# Patient Record
Sex: Male | Born: 1980 | Race: White | Hispanic: No | Marital: Single | Smoking: Current some day smoker
Health system: Southern US, Community
[De-identification: ages and names within clinical notes are randomized; demographics above are authoritative.]

## PROBLEM LIST (undated history)

## (undated) DIAGNOSIS — F32A Depression, unspecified: Secondary | ICD-10-CM

## (undated) DIAGNOSIS — F329 Major depressive disorder, single episode, unspecified: Secondary | ICD-10-CM

## (undated) DIAGNOSIS — J45909 Unspecified asthma, uncomplicated: Secondary | ICD-10-CM

---
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
## 2012-07-04 ENCOUNTER — Encounter (HOSPITAL_BASED_OUTPATIENT_CLINIC_OR_DEPARTMENT_OTHER): Payer: Self-pay | Admitting: Emergency Medicine

## 2012-07-04 ENCOUNTER — Emergency Department (HOSPITAL_BASED_OUTPATIENT_CLINIC_OR_DEPARTMENT_OTHER): Payer: 59

## 2012-07-04 ENCOUNTER — Emergency Department (HOSPITAL_BASED_OUTPATIENT_CLINIC_OR_DEPARTMENT_OTHER)
Admission: EM | Admit: 2012-07-04 | Discharge: 2012-07-04 | Disposition: A | Discharge status: Home / Self Care | Payer: 59 | Attending: Emergency Medicine | Admitting: Emergency Medicine

## 2012-07-04 DIAGNOSIS — J45909 Unspecified asthma, uncomplicated: Secondary | ICD-10-CM | POA: Insufficient documentation

## 2012-07-04 DIAGNOSIS — F17200 Nicotine dependence, unspecified, uncomplicated: Secondary | ICD-10-CM | POA: Insufficient documentation

## 2012-07-04 DIAGNOSIS — S93402A Sprain of unspecified ligament of left ankle, initial encounter: Secondary | ICD-10-CM

## 2012-07-04 DIAGNOSIS — S93409A Sprain of unspecified ligament of unspecified ankle, initial encounter: Secondary | ICD-10-CM | POA: Insufficient documentation

## 2012-07-04 DIAGNOSIS — Y929 Unspecified place or not applicable: Secondary | ICD-10-CM | POA: Insufficient documentation

## 2012-07-04 DIAGNOSIS — Y9389 Activity, other specified: Secondary | ICD-10-CM | POA: Insufficient documentation

## 2012-07-04 DIAGNOSIS — W108XXA Fall (on) (from) other stairs and steps, initial encounter: Secondary | ICD-10-CM | POA: Insufficient documentation

## 2012-07-04 HISTORY — DX: Unspecified asthma, uncomplicated: J45.909

## 2012-07-04 MED ORDER — TRAMADOL HCL 50 MG PO TABS: 50.0000 mg | ORAL_TABLET | Freq: Four times a day (QID) | ORAL | Status: DC | PRN

## 2012-07-04 NOTE — ED Provider Notes (Signed)
History     CSN: [id number]  Arrival date & time 07/04/12  4098   None     Chief Complaint  Patient presents with  . Ankle Pain    (Consider location/radiation/quality/duration/timing/severity/associated sxs/prior treatment) Patient is a 32 y.o. male presenting with ankle pain. The history is provided by the patient.  Ankle Pain Location:  Ankle Time since incident:  1 day Injury: yes   Mechanism of injury: fall   Fall:    Fall occurred: crawling into a window and landed on left lateral ankle.   Height of fall:  < 2 feet   Impact surface:  [employer] of impact: left ankle.   Entrapped after fall: no   Ankle location:  L ankle Pain details:    Quality:  Aching   Radiates to:  Does not radiate   Severity:  Severe   Duration:  1 day   Timing:  Constant   Progression:  Unchanged Chronicity:  New Dislocation: no   Foreign body present:  No foreign bodies Prior injury to area:  No Relieved by:  Nothing Worsened by:  Nothing tried Ineffective treatments:  None tried Associated symptoms: no muscle weakness   Risk factors: no concern for non-accidental trauma   has had previous injury  No past medical history on file.  No past surgical history on file.  No family history on file.  History  Substance Use Topics  . Smoking status: Not on file  . Smokeless tobacco: Not on file  . Alcohol Use: Not on file      Review of Systems  Skin: Negative for wound.  All other systems reviewed and are negative.    Allergies  Review of patient's allergies indicates not on file.  Home Medications  No current outpatient prescriptions on file.  There were no vitals taken for this visit.  Physical Exam  Constitutional: He is oriented to person, place, and time. He appears well-developed and well-nourished. No distress.  HENT:  Head: Normocephalic and atraumatic.  Eyes: Conjunctivae are normal. Pupils are equal, round, and reactive to light.  Neck: Normal range of  motion. Neck supple.  Cardiovascular: Normal rate, regular rhythm and intact distal pulses.   Pulmonary/Chest: Effort normal and breath sounds normal. He has no wheezes. He has no rales.  Abdominal: Soft. Bowel sounds are normal. There is no tenderness. There is no rebound and no guarding.  Musculoskeletal: Normal range of motion.  Left foot neurovascularly intact FROM of the left ankle, pain over the anterior talar ligament.  2+ dorsalis pedis cap refill to toes < 2 sec  Neurological: He is alert and oriented to person, place, and time.  Skin: Skin is warm and dry.    ED Course  Procedures (including critical care time)  Labs Reviewed - No data to display No results found.   No diagnosis found.    MDM  Suspect tiny bone fragment is from prior injury as there is not ecchymosis or swelling.  Have informed the patient of bony fragment and will treat as sprain with ASO and pain medications       Eryka Dolinger K Argil Mahl-Rasch, MD 07/04/12 [phone]

## 2012-07-04 NOTE — ED Notes (Signed)
Pt c/o left ankle pain after falling yesterday morning.

## 2014-08-19 ENCOUNTER — Emergency Department (HOSPITAL_BASED_OUTPATIENT_CLINIC_OR_DEPARTMENT_OTHER): Payer: Managed Care, Other (non HMO)

## 2014-08-19 ENCOUNTER — Emergency Department (HOSPITAL_BASED_OUTPATIENT_CLINIC_OR_DEPARTMENT_OTHER)
Admission: EM | Admit: 2014-08-19 | Discharge: 2014-08-19 | Disposition: A | Discharge status: Home / Self Care | Payer: Managed Care, Other (non HMO) | Attending: Emergency Medicine | Admitting: Emergency Medicine

## 2014-08-19 ENCOUNTER — Encounter (HOSPITAL_BASED_OUTPATIENT_CLINIC_OR_DEPARTMENT_OTHER): Payer: Self-pay

## 2014-08-19 DIAGNOSIS — Z880 Allergy status to penicillin: Secondary | ICD-10-CM | POA: Insufficient documentation

## 2014-08-19 DIAGNOSIS — Z720 Tobacco use: Secondary | ICD-10-CM | POA: Diagnosis not present

## 2014-08-19 DIAGNOSIS — J40 Bronchitis, not specified as acute or chronic: Secondary | ICD-10-CM | POA: Diagnosis not present

## 2014-08-19 DIAGNOSIS — R05 Cough: Secondary | ICD-10-CM | POA: Diagnosis present

## 2014-08-19 MED ORDER — ACETAMINOPHEN 500 MG PO TABS
1000.0000 mg | ORAL_TABLET | Freq: Once | ORAL | Status: AC
  Administered 2014-08-19: 1000 mg via ORAL
  Filled 2014-08-19: qty 2

## 2014-08-19 MED ORDER — PREDNISONE 20 MG PO TABS: ORAL_TABLET | ORAL | Status: DC

## 2014-08-19 MED ORDER — ALBUTEROL SULFATE HFA 108 (90 BASE) MCG/ACT IN AERS
2.0000 | INHALATION_SPRAY | RESPIRATORY_TRACT | Status: DC | PRN
  Administered 2014-08-19: 2 via RESPIRATORY_TRACT
  Filled 2014-08-19: qty 6.7

## 2014-08-19 MED ORDER — HYDROCODONE-HOMATROPINE 5-1.5 MG/5ML PO SYRP: 5.0000 mL | ORAL_SOLUTION | Freq: Four times a day (QID) | ORAL | Status: DC | PRN

## 2014-08-19 NOTE — Discharge Instructions (Signed)
Upper Respiratory Infection, Adult An upper respiratory infection (URI) is also sometimes known as the common cold. The upper respiratory tract includes the nose, sinuses, throat, trachea, and bronchi. Bronchi are the airways leading to the lungs. Most people improve within 1 week, but symptoms can last up to 2 weeks. A residual cough may last even longer.  CAUSES Many different viruses can infect the tissues lining the upper respiratory tract. The tissues become irritated and inflamed and often become very moist. Mucus production is also common. A cold is contagious. You can easily spread the virus to others by oral contact. This includes kissing, sharing a glass, coughing, or sneezing. Touching your mouth or nose and then touching a surface, which is then touched by another person, can also spread the virus. SYMPTOMS  Symptoms typically develop 1 to 3 days after you come in contact with a cold virus. Symptoms vary from person to person. They may include:  Runny nose.  Sneezing.  Nasal congestion.  Sinus irritation.  Sore throat.  Loss of voice (laryngitis).  Cough.  Fatigue.  Muscle aches.  Loss of appetite.  Headache.  Low-grade fever. DIAGNOSIS  You might diagnose your own cold based on familiar symptoms, since most people get a cold 2 to 3 times a year. Your caregiver can confirm this based on your exam. Most importantly, your caregiver can check that your symptoms are not due to another disease such as strep throat, sinusitis, pneumonia, asthma, or epiglottitis. Blood tests, throat tests, and X-rays are not necessary to diagnose a common cold, but they may sometimes be helpful in excluding other more serious diseases. Your caregiver will decide if any further tests are required. RISKS AND COMPLICATIONS  You may be at risk for a more severe case of the common cold if you smoke cigarettes, have chronic heart disease (such as heart failure) or lung disease (such as asthma), or if  you have a weakened immune system. The very young and very old are also at risk for more serious infections. Bacterial sinusitis, middle ear infections, and bacterial pneumonia can complicate the common cold. The common cold can worsen asthma and chronic obstructive pulmonary disease (COPD). Sometimes, these complications can require emergency medical care and may be life-threatening. PREVENTION  The best way to protect against getting a cold is to practice good hygiene. Avoid oral or hand contact with people with cold symptoms. Wash your hands often if contact occurs. There is no clear evidence that vitamin C, vitamin E, echinacea, or exercise reduces the chance of developing a cold. However, it is always recommended to get plenty of rest and practice good nutrition. TREATMENT  Treatment is directed at relieving symptoms. There is no cure. Antibiotics are not effective, because the infection is caused by a virus, not by bacteria. Treatment may include:  Increased fluid intake. Sports drinks offer valuable electrolytes, sugars, and fluids.  Breathing heated mist or steam (vaporizer or shower).  Eating chicken soup or other clear broths, and maintaining good nutrition.  Getting plenty of rest.  Using gargles or lozenges for comfort.  Controlling fevers with ibuprofen or acetaminophen as directed by your caregiver.  Increasing usage of your inhaler if you have asthma. Zinc gel and zinc lozenges, taken in the first 24 hours of the common cold, can shorten the duration and lessen the severity of symptoms. Pain medicines may help with fever, muscle aches, and throat pain. A variety of non-prescription medicines are available to treat congestion and runny nose. Your caregiver   can make recommendations and may suggest nasal or lung inhalers for other symptoms.  HOME CARE INSTRUCTIONS   Only take over-the-counter or prescription medicines for pain, discomfort, or fever as directed by your  caregiver.  Use a warm mist humidifier or inhale steam from a shower to increase air moisture. This may keep secretions moist and make it easier to breathe.  Drink enough water and fluids to keep your urine clear or pale yellow.  Rest as needed.  Return to work when your temperature has returned to normal or as your caregiver advises. You may need to stay home longer to avoid infecting others. You can also use a face mask and careful hand washing to prevent spread of the virus. SEEK MEDICAL CARE IF:   After the first few days, you feel you are getting worse rather than better.  You need your caregiver's advice about medicines to control symptoms.  You develop chills, worsening shortness of breath, or brown or red sputum. These may be signs of pneumonia.  You develop yellow or brown nasal discharge or pain in the face, especially when you bend forward. These may be signs of sinusitis.  You develop a fever, swollen neck glands, pain with swallowing, or white areas in the back of your throat. These may be signs of strep throat. SEEK IMMEDIATE MEDICAL CARE IF:   You have a fever.  You develop severe or persistent headache, ear pain, sinus pain, or chest pain.  You develop wheezing, a prolonged cough, cough up blood, or have a change in your usual mucus (if you have chronic lung disease).  You develop sore muscles or a stiff neck. Document Released: 09/25/2000 Document Revised: 06/24/2011 Document Reviewed: 07/07/2013 Exit[employer] Patient Information 2015 [employer]. This information is not intended to replace advice given to you by your health care provider. Make sure you discuss any questions you have with your health care provider.  

## 2014-08-19 NOTE — ED Notes (Addendum)
Pt with productive cough - white sputum since Wednesday. OTC meds have not been effective. PT also has runny nose, nasal congestion, headaches. Lungs clear in all fields bilaterally at this time. PT with recent sick contact with [facility].

## 2014-08-19 NOTE — ED Provider Notes (Signed)
CSN: [id number]     Arrival date & time 08/19/14  1350 History   First MD Initiated Contact with Patient 08/19/14 1512     Chief Complaint  Patient presents with  . Cough     (Consider location/radiation/quality/duration/timing/severity/associated sxs/prior Treatment) HPI Comments: Patient presents with cough. He states the last 2 days he's had a cough which has been productive of white and clear sputum. He has some runny nose and nasal congestion. He also has a sore throat. He has some mild shortness of breath which she feels is related to his asthma and he is currently out of his inhaler. He denies any leg pain or swelling. He denies any chest pain. He does note that today started having a fever. He is also having myalgias. He's been using over-the-counter medicines without relief in his cough.  Patient is a 34 y.o. male presenting with cough.  Cough Associated symptoms: fever, rhinorrhea, shortness of breath, sore throat and wheezing   Associated symptoms: no chest pain, no chills, no diaphoresis, no headaches and no rash     Past Medical History  Diagnosis Date  . Asthma    History reviewed. No pertinent past surgical history. History reviewed. No pertinent family history. History  Substance Use Topics  . Smoking status: Current Every Day Smoker  . Smokeless tobacco: Not on file  . Alcohol Use: No    Review of Systems  Constitutional: Positive for fever and fatigue. Negative for chills and diaphoresis.  HENT: Positive for congestion, postnasal drip, rhinorrhea and sore throat. Negative for sneezing, trouble swallowing and voice change.   Eyes: Negative.   Respiratory: Positive for cough, shortness of breath and wheezing. Negative for chest tightness.   Cardiovascular: Negative for chest pain and leg swelling.  Gastrointestinal: Negative for nausea, vomiting, abdominal pain, diarrhea and blood in stool.  Genitourinary: Negative for frequency, hematuria, flank pain and  difficulty urinating.  Musculoskeletal: Negative for back pain and arthralgias.  Skin: Negative for rash.  Neurological: Negative for dizziness, speech difficulty, weakness, numbness and headaches.      Allergies  Penicillins  Home Medications   Prior to Admission medications   Medication Sig Start Date End Date Taking? Authorizing Provider  HYDROcodone-homatropine (HYCODAN) 5-1.5 MG/5ML syrup Take 5 mLs by mouth every 6 (six) hours as needed for cough. 08/19/14   Rolan BuccoMelanie Camri Molloy, MD  predniSONE (DELTASONE) 20 MG tablet 3 tabs po day one, then 2 po daily x 4 days 08/19/14   Rolan BuccoMelanie Tami Blass, MD  traMADol (ULTRAM) 50 MG tablet Take 1 tablet (50 mg total) by mouth every 6 (six) hours as needed for pain. 07/04/12   April Palumbo, MD   BP 110/82 mmHg  Pulse 105  Temp(Src) 101.5 F (38.6 C) (Oral)  Resp 16  Ht 5\' 5"  (1.651 m)  Wt 190 lb (86.183 kg)  BMI 31.62 kg/m2  SpO2 98% Physical Exam  Constitutional: He is oriented to person, place, and time. He appears well-developed and well-nourished.  HENT:  Head: Normocephalic and atraumatic.  Right Ear: External ear normal.  Left Ear: External ear normal.  Mouth/Throat: Oropharynx is clear and moist. No oropharyngeal exudate.  Eyes: Pupils are equal, round, and reactive to light.  Neck: Normal range of motion. Neck supple.  Cardiovascular: Normal rate, regular rhythm and normal heart sounds.   Pulmonary/Chest: Effort normal. No respiratory distress. He has wheezes. He has no rales. He exhibits no tenderness.  Scarce expiratory wheezing bilaterally. No increased work of breathing  Abdominal: Soft. Bowel  sounds are normal. There is no tenderness. There is no rebound and no guarding.  Musculoskeletal: Normal range of motion. He exhibits no edema.  No edema or calf tenderness  Lymphadenopathy:    He has no cervical adenopathy.  Neurological: He is alert and oriented to person, place, and time.  Skin: Skin is warm and dry. No rash noted.   Psychiatric: He has a normal mood and affect.    ED Course  Procedures (including critical care time) Labs Review Labs Reviewed - No data to display  Imaging Review Dg Chest 2 View  08/19/2014   CLINICAL DATA:  Cough and fever for 2 days, smoker  EXAM: CHEST  2 VIEW  COMPARISON:  None.  FINDINGS: Cardiomediastinal silhouette is unremarkable. No acute infiltrate or pleural effusion. No pulmonary edema. Mild perihilar increased bronchial markings without focal consolidation. Bony thorax is unremarkable.  IMPRESSION: No acute infiltrate or pulmonary edema. Mild perihilar increased bronchial markings without focal consolidation.   Electronically Signed   By: Natasha MeadLiviu  Pop M.D.   On: 08/19/2014 14:51     EKG Interpretation None      MDM   Final diagnoses:  Bronchitis    Patient presents with cough and URI symptoms. He likely has a bronchitis. There is no evidence of pneumonia. His lungs have very scarce wheezing place talking in full senses and has no increased work of breathing. His oxygen saturations are normal. He has no suggestions of a pulmonary embolus. He was discharged home in good condition. He was given a prescription for prednisone, Hycodan cough syrup and dispensed an albuterol inhaler. He was encouraged to follow-up with a primary care physician if his symptoms are not improving or return here as needed for any worsening symptoms.    Rolan BuccoMelanie Nasser Ku, MD 08/19/14 [phone]

## 2014-08-21 ENCOUNTER — Encounter (HOSPITAL_COMMUNITY): Payer: Self-pay | Admitting: *Deleted

## 2014-08-21 ENCOUNTER — Emergency Department (HOSPITAL_COMMUNITY)
Admission: EM | Admit: 2014-08-21 | Discharge: 2014-08-21 | Disposition: A | Discharge status: Home / Self Care | Payer: Managed Care, Other (non HMO) | Attending: Emergency Medicine | Admitting: Emergency Medicine

## 2014-08-21 DIAGNOSIS — J159 Unspecified bacterial pneumonia: Secondary | ICD-10-CM | POA: Diagnosis not present

## 2014-08-21 DIAGNOSIS — J45909 Unspecified asthma, uncomplicated: Secondary | ICD-10-CM | POA: Insufficient documentation

## 2014-08-21 DIAGNOSIS — R630 Anorexia: Secondary | ICD-10-CM | POA: Diagnosis not present

## 2014-08-21 DIAGNOSIS — R000 Tachycardia, unspecified: Secondary | ICD-10-CM | POA: Insufficient documentation

## 2014-08-21 DIAGNOSIS — R05 Cough: Secondary | ICD-10-CM | POA: Diagnosis present

## 2014-08-21 DIAGNOSIS — Z720 Tobacco use: Secondary | ICD-10-CM | POA: Diagnosis not present

## 2014-08-21 DIAGNOSIS — Z880 Allergy status to penicillin: Secondary | ICD-10-CM | POA: Insufficient documentation

## 2014-08-21 DIAGNOSIS — M791 Myalgia: Secondary | ICD-10-CM | POA: Diagnosis not present

## 2014-08-21 DIAGNOSIS — J189 Pneumonia, unspecified organism: Secondary | ICD-10-CM

## 2014-08-21 MED ORDER — ACETAMINOPHEN 325 MG PO TABS
650.0000 mg | ORAL_TABLET | Freq: Once | ORAL | Status: AC
  Administered 2014-08-21: 650 mg via ORAL
  Filled 2014-08-21: qty 2

## 2014-08-21 MED ORDER — BENZONATATE 100 MG PO CAPS: 100.0000 mg | ORAL_CAPSULE | Freq: Three times a day (TID) | ORAL | Status: DC

## 2014-08-21 MED ORDER — HYDROCODONE-HOMATROPINE 5-1.5 MG/5ML PO SYRP: 5.0000 mL | ORAL_SOLUTION | Freq: Four times a day (QID) | ORAL | Status: DC | PRN

## 2014-08-21 MED ORDER — AZITHROMYCIN 250 MG PO TABS: 250.0000 mg | ORAL_TABLET | Freq: Every day | ORAL | Status: DC

## 2014-08-21 MED ORDER — AZITHROMYCIN 250 MG PO TABS
500.0000 mg | ORAL_TABLET | Freq: Once | ORAL | Status: AC
  Administered 2014-08-21: 500 mg via ORAL
  Filled 2014-08-21: qty 2

## 2014-08-21 MED ORDER — BENZONATATE 100 MG PO CAPS
100.0000 mg | ORAL_CAPSULE | Freq: Once | ORAL | Status: AC
Start: 2014-08-21 — End: 2014-08-21
  Administered 2014-08-21: 100 mg via ORAL
  Filled 2014-08-21: qty 1

## 2014-08-21 MED ORDER — SODIUM CHLORIDE 0.9 % IV BOLUS (SEPSIS)
1000.0000 mL | Freq: Once | INTRAVENOUS | Status: AC
  Administered 2014-08-21: 1000 mL via INTRAVENOUS

## 2014-08-21 NOTE — Discharge Instructions (Signed)
Please read and follow all provided instructions.  Your diagnoses today include:  1. Community acquired pneumonia    Tests performed today include:  Vital signs. See below for your results today.   Medications prescribed:   Azithromycin - antibiotic for respiratory infection  You have been prescribed an antibiotic medicine: take the entire course of medicine even if you are feeling better. Stopping early can cause the antibiotic not to work.   Tessalon Perles - cough suppressant medication   Hycodan - narcotic cough suppressant syrup  You have been prescribed narcotic cough suppressant such as Tussinex: DO NOT drive or perform any activities that require you to be awake and alert because this medicine can make you drowsy.   Take any prescribed medications only as directed.  Home care instructions:  Follow any educational materials contained in this packet.  Take the complete course of antibiotics that you were prescribed.    Follow-up instructions: Please follow-up with your primary care provider in the next 3 days for further evaluation of your symptoms and to ensure resolution of your infection.   Return instructions:   Please return to the Emergency Department if you experience worsening symptoms.   Return immediately with worsening breathing, worsening shortness of breath, or if you feel it is taking you more effort to breathe.   Please return if you have any other emergent concerns.  Additional Information:  Your vital signs today were: BP 118/74 mmHg   Pulse 116   Temp(Src) 101.5 F (38.6 C) (Oral)   Resp 20   SpO2 95% If your blood pressure (BP) was elevated above 135/85 this visit, please have this repeated by your doctor within one month. --------------

## 2014-08-21 NOTE — ED Provider Notes (Signed)
CSN: [id number]     Arrival date & time 08/21/14  1151 History   First MD Initiated Contact with Patient 08/21/14 1218     Chief Complaint  Patient presents with  . URI     (Consider location/radiation/quality/duration/timing/severity/associated sxs/prior Treatment) HPI Comments: Patient presents with continued fever, productive cough, fatigue which has been ongoing for 4 days. Also complains of decreased appetite and fluid intake. Patient was seen at [facility] 2 days ago was diagnosed with bronchitis. He had a negative x-ray. Patient was prescribed an albuterol inhaler which he has been using. He was also prescribed prednisone and cough syrup which he was unable to pick up. Patient denies shortness of breath or chest pain. He has been using ibuprofen for fever and body aches. No hemoptysis. No skin rashes. No urinary symptoms. No nausea, vomiting, or diarrhea. The onset of this condition was acute. The course is constant. Aggravating factors: none. Alleviating factors: none.    The history is provided by the patient and a parent.    Past Medical History  Diagnosis Date  . Asthma    History reviewed. No pertinent past surgical history. History reviewed. No pertinent family history. History  Substance Use Topics  . Smoking status: Current Every Day Smoker  . Smokeless tobacco: Not on file  . Alcohol Use: No    Review of Systems  Constitutional: Positive for fever, chills, appetite change and fatigue.  HENT: Positive for sore throat (worse with coughing). Negative for congestion and rhinorrhea.   Eyes: Negative for redness.  Respiratory: Positive for cough. Negative for shortness of breath.   Cardiovascular: Negative for chest pain.  Gastrointestinal: Negative for nausea, vomiting, abdominal pain and diarrhea.  Genitourinary: Negative for dysuria.  Musculoskeletal: Positive for myalgias.  Skin: Negative for rash.  Neurological: Negative for headaches.       Allergies  Penicillins  Home Medications   Prior to Admission medications   Medication Sig Start Date End Date Taking? Authorizing Provider  guaiFENesin (ROBITUSSIN) 100 MG/5ML SOLN Take 5 mLs by mouth every 4 (four) hours as needed for cough or to loosen phlegm.   Yes Historical Provider, MD  ibuprofen (ADVIL,MOTRIN) 200 MG tablet Take 400 mg by mouth every 4 (four) hours as needed (for pain).   Yes Historical Provider, MD  HYDROcodone-homatropine (HYCODAN) 5-1.5 MG/5ML syrup Take 5 mLs by mouth every 6 (six) hours as needed for cough. Patient not taking: Reported on 08/21/2014 08/19/14   Rolan BuccoMelanie Belfi, MD  predniSONE (DELTASONE) 20 MG tablet 3 tabs po day one, then 2 po daily x 4 days Patient not taking: Reported on 08/21/2014 08/19/14   Rolan BuccoMelanie Belfi, MD  traMADol (ULTRAM) 50 MG tablet Take 1 tablet (50 mg total) by mouth every 6 (six) hours as needed for pain. Patient not taking: Reported on 08/21/2014 07/04/12   April Palumbo, MD   BP 118/74 mmHg  Pulse 116  Temp(Src) 101.5 F (38.6 C) (Oral)  Resp 20  SpO2 95%   Physical Exam  Constitutional: He appears well-developed and well-nourished.  HENT:  Head: Normocephalic and atraumatic.  Right Ear: Tympanic membrane, external ear and ear canal normal.  Left Ear: Tympanic membrane, external ear and ear canal normal.  Nose: Nose normal. No mucosal edema or rhinorrhea.  Mouth/Throat: Uvula is midline and mucous membranes are normal. Mucous membranes are not dry. No trismus in the jaw. No uvula swelling. No oropharyngeal exudate, posterior oropharyngeal edema, posterior oropharyngeal erythema or tonsillar abscesses.  Eyes: Conjunctivae are  normal. Pupils are equal, round, and reactive to light. Right eye exhibits no discharge. Left eye exhibits no discharge.  Neck: Normal range of motion. Neck supple.  Cardiovascular: Regular rhythm and normal heart sounds.  Tachycardia present.   No murmur heard. Pulmonary/Chest: Effort normal. No  respiratory distress. He has no wheezes. He has rales (Patient with crackles left middle and left base).  Abdominal: Soft. There is no tenderness.  Musculoskeletal: He exhibits no edema or tenderness.  Neurological: He is alert.  Skin: Skin is warm and dry.  Psychiatric: He has a normal mood and affect.  Nursing note and vitals reviewed.   ED Course  Procedures (including critical care time) Labs Review Labs Reviewed - No data to display  Imaging Review Dg Chest 2 View  08/19/2014   CLINICAL DATA:  Cough and fever for 2 days, smoker  EXAM: CHEST  2 VIEW  COMPARISON:  None.  FINDINGS: Cardiomediastinal silhouette is unremarkable. No acute infiltrate or pleural effusion. No pulmonary edema. Mild perihilar increased bronchial markings without focal consolidation. Bony thorax is unremarkable.  IMPRESSION: No acute infiltrate or pulmonary edema. Mild perihilar increased bronchial markings without focal consolidation.   Electronically Signed   By: Natasha MeadLiviu  Pop M.D.   On: 08/19/2014 14:51     EKG Interpretation None       12:53 PM Patient seen and examined. Work-up initiated. Medications ordered.   Vital signs reviewed and are as follows: BP 118/74 mmHg  Pulse 116  Temp(Src) 101.5 F (38.6 C) (Oral)  Resp 20  SpO2 95%  2:38 PM patient feels better with symptomatic treatment. Patient has exam consistent with community-acquired pneumonia. Suspect left lower lobe pneumonia given pulmonary findings.  Antibiotic started in emergency department.  Patient urged to return with worsening symptoms or other concerns. Patient verbalized understanding and agrees with plan.     MDM   Final diagnoses:  Community acquired pneumonia   Patients with exam and history consistent with community-acquired pneumonia. Patient has definite crackles on exam and I suspect left lower lobe pneumonia. Do not feel that repeat imaging is indicated at this time as patient clinically has pneumonia. He has normal  pulse ox. He does not appear septic. Do not feel that extensive lab testing is to be performed at this time. Patient started on antibiotics. Symptoms controlled in emergency department with supportive measures. Patient counseled on return instructions.    Renne CriglerJoshua Genna Casimir, PA-C 08/21/14 1440  Gerhard Munchobert Lockwood, MD 08/21/14 [phone]

## 2014-08-21 NOTE — ED Notes (Addendum)
Pt c/o URI, sts seen at [facility] recently and was diagnosed with bronchitis but hasn't been able to fill his prescription. He c/o productive cough and pain in his ribcage. Lung sounds are clear,diminsished, no fever.

## 2015-06-13 ENCOUNTER — Emergency Department (HOSPITAL_COMMUNITY)
Admission: EM | Admit: 2015-06-13 | Discharge: 2015-06-13 | Disposition: A | Discharge status: Other Acute Inpatient Hospital | Payer: Federal, State, Local not specified - Other | Attending: Emergency Medicine | Admitting: Emergency Medicine

## 2015-06-13 ENCOUNTER — Encounter (HOSPITAL_COMMUNITY): Payer: Self-pay

## 2015-06-13 ENCOUNTER — Inpatient Hospital Stay (HOSPITAL_COMMUNITY)
Admission: AD | Admit: 2015-06-13 | Discharge: 2015-06-20 | DRG: 885 | Disposition: A | Discharge status: Home / Self Care | Payer: Medicaid Other | Source: Intra-hospital | Attending: Psychiatry | Admitting: Psychiatry

## 2015-06-13 ENCOUNTER — Encounter (HOSPITAL_COMMUNITY): Payer: Self-pay | Admitting: Emergency Medicine

## 2015-06-13 DIAGNOSIS — F250 Schizoaffective disorder, bipolar type: Principal | ICD-10-CM | POA: Diagnosis present

## 2015-06-13 DIAGNOSIS — Z008 Encounter for other general examination: Secondary | ICD-10-CM | POA: Diagnosis present

## 2015-06-13 DIAGNOSIS — J45909 Unspecified asthma, uncomplicated: Secondary | ICD-10-CM | POA: Diagnosis not present

## 2015-06-13 DIAGNOSIS — F410 Panic disorder [episodic paroxysmal anxiety] without agoraphobia: Secondary | ICD-10-CM | POA: Diagnosis present

## 2015-06-13 DIAGNOSIS — Z79899 Other long term (current) drug therapy: Secondary | ICD-10-CM

## 2015-06-13 DIAGNOSIS — R45851 Suicidal ideations: Secondary | ICD-10-CM | POA: Diagnosis not present

## 2015-06-13 DIAGNOSIS — F29 Unspecified psychosis not due to a substance or known physiological condition: Secondary | ICD-10-CM | POA: Diagnosis present

## 2015-06-13 DIAGNOSIS — F4323 Adjustment disorder with mixed anxiety and depressed mood: Secondary | ICD-10-CM | POA: Diagnosis not present

## 2015-06-13 DIAGNOSIS — Z792 Long term (current) use of antibiotics: Secondary | ICD-10-CM | POA: Diagnosis not present

## 2015-06-13 DIAGNOSIS — F17200 Nicotine dependence, unspecified, uncomplicated: Secondary | ICD-10-CM | POA: Insufficient documentation

## 2015-06-13 DIAGNOSIS — Z880 Allergy status to penicillin: Secondary | ICD-10-CM | POA: Insufficient documentation

## 2015-06-13 DIAGNOSIS — Z818 Family history of other mental and behavioral disorders: Secondary | ICD-10-CM

## 2015-06-13 DIAGNOSIS — F329 Major depressive disorder, single episode, unspecified: Secondary | ICD-10-CM

## 2015-06-13 DIAGNOSIS — Z590 Homelessness: Secondary | ICD-10-CM

## 2015-06-13 DIAGNOSIS — F1310 Sedative, hypnotic or anxiolytic abuse, uncomplicated: Secondary | ICD-10-CM | POA: Diagnosis not present

## 2015-06-13 DIAGNOSIS — F17290 Nicotine dependence, other tobacco product, uncomplicated: Secondary | ICD-10-CM | POA: Diagnosis present

## 2015-06-13 DIAGNOSIS — F4321 Adjustment disorder with depressed mood: Secondary | ICD-10-CM | POA: Diagnosis present

## 2015-06-13 DIAGNOSIS — F3181 Bipolar II disorder: Secondary | ICD-10-CM | POA: Diagnosis present

## 2015-06-13 DIAGNOSIS — G4700 Insomnia, unspecified: Secondary | ICD-10-CM | POA: Diagnosis present

## 2015-06-13 DIAGNOSIS — F32A Depression, unspecified: Secondary | ICD-10-CM

## 2015-06-13 HISTORY — DX: Major depressive disorder, single episode, unspecified: F32.9

## 2015-06-13 HISTORY — DX: Depression, unspecified: F32.A

## 2015-06-13 LAB — COMPREHENSIVE METABOLIC PANEL
ALK PHOS: 80 U/L (ref 38–126)
ALT: 29 U/L (ref 17–63)
AST: 37 U/L (ref 15–41)
Albumin: 5.2 g/dL — ABNORMAL HIGH (ref 3.5–5.0)
Anion gap: 12 (ref 5–15)
BUN: 11 mg/dL (ref 6–20)
CALCIUM: 9.7 mg/dL (ref 8.9–10.3)
CHLORIDE: 106 mmol/L (ref 101–111)
CO2: 26 mmol/L (ref 22–32)
CREATININE: 1.11 mg/dL (ref 0.61–1.24)
GFR calc Af Amer: >60 mL/min (ref >60)
GFR calc non Af Amer: >60 mL/min (ref >60)
Glucose, Bld: 92 mg/dL (ref 65–99)
Potassium: 3.6 mmol/L (ref 3.5–5.1)
SODIUM: 144 mmol/L (ref 135–145)
Total Bilirubin: 0.8 mg/dL (ref 0.3–1.2)
Total Protein: 8.3 g/dL — ABNORMAL HIGH (ref 6.5–8.1)

## 2015-06-13 LAB — CBC
HCT: 48.3 % (ref 39.0–52.0)
Hemoglobin: 15.7 g/dL (ref 13.0–17.0)
MCH: 30.7 pg (ref 26.0–34.0)
MCHC: 32.5 g/dL (ref 30.0–36.0)
MCV: 94.5 fL (ref 78.0–100.0)
PLATELETS: 202 10*3/uL (ref 150–400)
RBC: 5.11 MIL/uL (ref 4.22–5.81)
RDW: 13.1 % (ref 11.5–15.5)
WBC: 14.4 10*3/uL — AB (ref 4.0–10.5)

## 2015-06-13 LAB — ACETAMINOPHEN LEVEL

## 2015-06-13 LAB — RAPID URINE DRUG SCREEN, HOSP PERFORMED
AMPHETAMINES: NOT DETECTED
Barbiturates: NOT DETECTED
Benzodiazepines: POSITIVE — AB
COCAINE: NOT DETECTED
OPIATES: NOT DETECTED
Tetrahydrocannabinol: NOT DETECTED

## 2015-06-13 LAB — SALICYLATE LEVEL

## 2015-06-13 LAB — ETHANOL: Alcohol, Ethyl (B): <5 mg/dL (ref <5)

## 2015-06-13 MED ORDER — ACETAMINOPHEN 325 MG PO TABS: 650.0000 mg | ORAL_TABLET | Freq: Four times a day (QID) | ORAL | Status: DC | PRN

## 2015-06-13 MED ORDER — TRAZODONE HCL 50 MG PO TABS
50.0000 mg | ORAL_TABLET | Freq: Every evening | ORAL | Status: DC | PRN
  Administered 2015-06-13 – 2015-06-18 (×7): 50 mg via ORAL
  Filled 2015-06-13 (×15): qty 1

## 2015-06-13 MED ORDER — ALUM & MAG HYDROXIDE-SIMETH 200-200-20 MG/5ML PO SUSP: 30.0000 mL | ORAL | Status: DC | PRN

## 2015-06-13 MED ORDER — MAGNESIUM HYDROXIDE 400 MG/5ML PO SUSP: 30.0000 mL | Freq: Every day | ORAL | Status: DC | PRN

## 2015-06-13 MED ORDER — FLUOXETINE HCL 20 MG PO CAPS
20.0000 mg | ORAL_CAPSULE | Freq: Every evening | ORAL | Status: DC
  Administered 2015-06-13 – 2015-06-14 (×2): 20 mg via ORAL
  Filled 2015-06-13 (×4): qty 1

## 2015-06-13 MED ORDER — RISPERIDONE 3 MG PO TABS
3.0000 mg | ORAL_TABLET | Freq: Every day | ORAL | Status: DC
  Administered 2015-06-13 – 2015-06-19 (×7): 3 mg via ORAL
  Filled 2015-06-13: qty 1
  Filled 2015-06-13: qty 3
  Filled 2015-06-13 (×2): qty 1
  Filled 2015-06-13: qty 3
  Filled 2015-06-13: qty 7
  Filled 2015-06-13 (×5): qty 1

## 2015-06-13 NOTE — Progress Notes (Signed)
Shawn Koch is alert and awake in bed at shift change.  Does not initiate conversation.  Answers questions and has good eye contact.  Diag. Depressive D/O and anxiety D/O  Until very recently he was living with his mother and sister but they lost the home. His Mom has moved in with a friend in [place] and his sister whom he states is a heroin addict has moved in with her boyfriend in another city.  Patient states growing up both of his parents were cocaine addicts and his Mom is sober at this time. Patient states he spent the last couple of nights in a motel, but left yesterday and attempted to walk with his two very large and heavy bags to [address] to admit himself. He did end up calling 911 after a very long walk. Patient states he called his mother yesterday and she told him she couldn't help him.Patient has several healed "cutting" scars mostly to upper arms, and other circular not completely healed areas to mostly left arm where he readily admits to intentional self cutting in the past (ages 42 to 58) and the cigarette burning since "around December".

## 2015-06-13 NOTE — BH Assessment (Signed)
BHH Assessment Progress Note  Per Nanine Means, NP, this pt would benefit from admission to the [facility] Observation Unit at this time.  Berneice Heinrich, RN, [facility] has assigned pt to Obs 5.  Pt has signed Voluntary Admission and Consent for Treatment, as well as Consent to Release Information to no one, and signed forms have been faxed to [facility].  Pt's nurse, Kendal Hymen, has been notified, and agrees to send original paperwork along with pt via Juel Burrow, and to call report to [phone] or [phone].  Doylene Canning, MA Triage Specialist [phone]

## 2015-06-13 NOTE — Progress Notes (Signed)
Nursing Admission Note:  Patient arriving Voluntary from [facility] ED with Diagnosis of Depressive D/O and Anxiety D/O. Patient is polite, cooperative, appropriate, has all of his possessions with him stating that until very recently he was living with his mother and sister but they lost the home. His Mom has moved in with a friend in [place] and his sister whom he states is a heroin addict has moved in with her boyfriend in another city.  Patient states growing up both of his parents were cocaine addicts and his Mom is sober at this time. Patient states he spent the last couple of nights in a motel, but left yesterday and attempted to walk with his two very large and heavy bags to [facility] to admit himself. He did end up calling 911 after a very long walk. Patient states he called his mother yesterday and she told him she couldn't help him. 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 During search Nurse observing patient has several healed "cutting" scars mostly to upper arms, and other circular not completely healed areas to mostly left arm where he readily admits to intentional self cutting in the past (ages 17 to 66) and the cigarette burning since "around December". Pt admits to some passive SI that are increasing to actually thinking about killing himself but has not developed any plan. States "I wanted to come here cause I know I need help".

## 2015-06-13 NOTE — ED Notes (Signed)
Pt oriented to room and unit.  Pt contracts for safety.  Denies H/I and AVH.  Alert and oriented.  15 minute checks and video monitoring continue.

## 2015-06-13 NOTE — BH Assessment (Addendum)
Assessment Note  Shawn Koch is an 35 y.o. male. He presents to [facility] voluntarily brought by [employer]. Sts he called 911 today with a complaint of suicidal ideations. Patient has felt suicidal "all my life". His suicidal thoughts have become more intrusive and frequent. He does not have a suicidal plan. He has no history of suicide attempts. He does report self mutilating behaviors such as burning his skin and cutting. His suicidal thoughts are related to no support from his mother/family. Patient sts, "My family have turned on me". Patient was also living with his mother up until few days ago. Sts they were evicted from their home because his mother lost her job. Patient left the home to stay in a hotel room. His mother left and went to live with a family member.  Patient called his mother yesterday to ask if he could come stay with her and the family member. Patient's mother responded, "No I can't take care of you anymore and I moved away from [place]". Patient is now homeless.   Patient denies HI. He denies legal issues. He is currently calm and cooperative. He reports auditory hallucinations of voices calling him name. No command type hallucinations. No visual hallucinations.   Patient has a previous history of drug use. Most recent use includes THC which was 1 month ago. He does not have a psychiatrist or therapist. No history of inpatient treatment.    Diagnosis: Depressive Disorder and Anxiety Disorder  Past Medical History:  Past Medical History  Diagnosis Date  . Asthma     History reviewed. No pertinent past surgical history.  Family History: History reviewed. No pertinent family history.  Social History:  reports that he has been smoking.  He does not have any smokeless tobacco history on file. He reports that he does not drink alcohol. His drug history is not on file.  Additional Social History:  Alcohol / Drug Use Pain Medications: SEE MAR Prescriptions: SEE MAR Over the  Counter: SEE MAR History of alcohol / drug use?: No history of alcohol / drug abuse Substance #1 Name of Substance 1: Alcohol (patient reports a history of heavy use) 1 - Age of First Use: unk 1 - Amount (size/oz): unk 1 - Frequency: unk 1 - Duration: unk 1 - Last Use / Amount: 13 yrs ago  Substance #2 Name of Substance 2: THC 2 - Age of First Use: 35 yrs old 2 - Amount (size/oz): varies 2 - Frequency:  on-going; "occasional use" 2 - Duration: on-going "off and on" since the age of 61 2 - Last Use / Amount: 1 month ago  CIWA: CIWA-Ar BP: 127/90 mmHg Pulse Rate: 99 COWS:    Allergies:  Allergies  Allergen Reactions  . Penicillins Hives    Home Medications:  (Not in a hospital admission)  OB/GYN Status:  No LMP for male patient.  General Assessment Data Location of Assessment: [facility] ED TTS Assessment: In system Is this a Tele or Face-to-Face Assessment?: Face-to-Face Is this an Initial Assessment or a Re-assessment for this encounter?: Initial Assessment Marital status: Single Maiden name:  (n/a) Is patient pregnant?: No Pregnancy Status: No Living Arrangements: Other (Comment) (homeless) Can pt return to current living arrangement?: Yes Admission Status: Voluntary Is patient capable of signing voluntary admission?: Yes Referral Source: Self/Family/Friend Insurance type:  (Self Pay)     Crisis Care Plan Living Arrangements: Other (Comment) (homeless) Legal Guardian:  (No guardian ) Name of Psychiatrist:  (No psychiatrist ) Name of Therapist:  (  No therapist )  Education Status Is patient currently in school?: No Current Grade:  (n/a) Highest grade of school patient has completed:  (H.S.) Name of school:  (none reported) Contact person:  (n/a)  Risk to self with the past 6 months Suicidal Ideation: Yes-Currently Present Has patient been a risk to self within the past 6 months prior to admission? : Yes Suicidal Intent: Yes-Currently Present Has patient had  any suicidal intent within the past 6 months prior to admission? : Yes Is patient at risk for suicide?: Yes Suicidal Plan?: No Has patient had any suicidal plan within the past 6 months prior to admission? : No Access to Means: No What has been your use of drugs/alcohol within the last 12 months?:  (patient reports a  history of alcohol and drug use) Previous Attempts/Gestures: No How many times?:  (n/a) Other Self Harm Risks:  (n/a) Triggers for Past Attempts:  (no previous attempts or gestures) Intentional Self Injurious Behavior: Cutting, Burning Comment - Self Injurious Behavior:  (patient has a history superficial cutting and burning skin) Family Suicide History: No Recent stressful life event(s): Other (Comment) ("I have not family"; "They turned their backs on me") Persecutory voices/beliefs?: No Depression: Yes Depression Symptoms: Feeling angry/irritable, Feeling worthless/self pity, Loss of interest in usual pleasures, Guilt, Fatigue, Isolating, Tearfulness, Insomnia, Despondent Substance abuse history and/or treatment for substance abuse?: No Suicide prevention information given to non-admitted patients: Not applicable  Risk to Others within the past 6 months Homicidal Ideation: No Does patient have any lifetime risk of violence toward others beyond the six months prior to admission? : No Thoughts of Harm to Others: No Current Homicidal Intent: No Current Homicidal Plan: No Access to Homicidal Means: No Identified Victim:  (n/a) History of harm to others?: No Assessment of Violence: None Noted Violent Behavior Description:  (patient is calm and cooperative ) Does patient have access to weapons?: No Criminal Charges Pending?: No Does patient have a court date: No Is patient on probation?: No  Psychosis Hallucinations: Auditory ("Voices calling my name") Delusions: None noted (no delusions)  Mental Status Report Appearance/Hygiene: In scrubs Eye Contact: Good Motor  Activity: Freedom of movement Speech: Logical/coherent Level of Consciousness: Alert Mood: Depressed Affect: Appropriate to circumstance Anxiety Level: None Thought Processes: Coherent, Relevant Judgement: Impaired Orientation: Person, Place, Time, Situation Obsessive Compulsive Thoughts/Behaviors: None  Cognitive Functioning Concentration: Good Memory: Recent Intact, Remote Intact IQ: Average Insight: Fair Impulse Control: Fair Appetite: Poor Weight Loss:  (40 pounds in the past 1.5 yrs) Weight Gain:  (none reported) Sleep: Decreased Total Hours of Sleep:  (varies ) Vegetative Symptoms: None  ADLScreening [facility] Assessment Services) Patient's cognitive ability adequate to safely complete daily activities?: Yes Patient able to express need for assistance with ADLs?: Yes Independently performs ADLs?: Yes (appropriate for developmental age)  Prior Inpatient Therapy Prior Inpatient Therapy: No Prior Therapy Dates:  (n/a) Prior Therapy Facilty/Provider(s): n/a Reason for Treatment:  (n/a)  Prior Outpatient Therapy Prior Outpatient Therapy: No Prior Therapy Dates:  (n/a) Prior Therapy Facilty/Provider(s):  (n/a) Reason for Treatment:  (n/a) Does patient have an ACCT team?: No Does patient have Intensive In-House Services?  : No Does patient have [facility] services? : No Does patient have P4CC services?: No  ADL Screening (condition at time of admission) Patient's cognitive ability adequate to safely complete daily activities?: Yes Is the patient deaf or have difficulty hearing?: No Does the patient have difficulty seeing, even when wearing glasses/contacts?: No Does the patient have difficulty concentrating,  remembering, or making decisions?: No Patient able to express need for assistance with ADLs?: Yes Does the patient have difficulty dressing or bathing?: No Independently performs ADLs?: Yes (appropriate for developmental age) Does the patient have difficulty walking or  climbing stairs?: No Weakness of Legs: None Weakness of Arms/Hands: None  Home Assistive Devices/Equipment Home Assistive Devices/Equipment: None    Abuse/Neglect Assessment (Assessment to be complete while patient is alone) Physical Abuse: Denies Verbal Abuse: Denies Sexual Abuse: Denies Exploitation of patient/patient's resources: Denies Self-Neglect: Denies Values / Beliefs Cultural Requests During Hospitalization: None Spiritual Requests During Hospitalization: None   Advance Directives (For Healthcare) Does patient have an advance directive?: No Would patient like information on creating an advanced directive?: No - patient declined information    Additional Information 1:1 In Past 12 Months?: No CIRT Risk: No Elopement Risk: No Does patient have medical clearance?: Yes     Disposition:  Disposition Initial Assessment Completed for this Encounter: Yes  On Site Evaluation by:   Reviewed with Physician:    Octaviano Batty 06/13/2015 9:42 AM

## 2015-06-13 NOTE — ED Notes (Signed)
MD in to see patient. Pt agreeable and calm. Valuables and patient wanded by security. Two large duffle bags with clothes and two white valuables/belongings bags with clothes obtained from patient and searched by security.

## 2015-06-13 NOTE — Plan of Care (Signed)
[facility] Crisis Plan  Reason for Crisis Plan:  Crisis Stabilization   Plan of Care:  Referral for Inpatient Hospitalization  Family Support:      Current Living Environment:  Living Arrangements: Alone    Insurance:   Hospital Account    Name Acct ID Class Status Primary Coverage   Shawn, Koch [id number] BEHAVIORAL HEALTH OBSERVATION Open [facility] FOR MH/DD/SAS - [place] [address]        Guarantor Account (for Hospital Account [id number])    Name Relation to Pt Service Area Active? Acct Type   Shawn Koch Self [facility] Yes [facility]   Address Phone       [address] [place], [place] [address] [phone])          Coverage Information (for Hospital Account [id number])    F/O Payor/Plan Precert #   [facility] FOR MH/DD/SAS/[place] [address]    Subscriber Subscriber #   Shawn, Koch [id number]   Address Phone   [address] [place], [place] [address] [phone]      Legal Guardian:     Primary Care Provider:  No primary care provider on file.  Current Outpatient Providers:  :    Psychiatrist:     Counselor/Therapist:     Compliant with Medications:  Yes  Additional Information:   Shawn Koch 2/28/20175:02 PM

## 2015-06-13 NOTE — ED Notes (Signed)
Pt discharged ambulatory with [place] driver.  All belongings were returned to patient. 

## 2015-06-13 NOTE — ED Provider Notes (Signed)
CSN: [id number]     Arrival date & time 06/13/15  4098 History   First MD Initiated Contact with Patient 06/13/15 0815     Chief Complaint  Patient presents with  . Suicidal  . Medical Clearance     (Consider location/radiation/quality/duration/timing/severity/associated sxs/prior Treatment) HPI.Marland KitchenMarland KitchenMarland KitchenPatient is depressed with suicidal ideation. He currently has no job and his family has shunned him. No alcohol or cocaine. He does smoke marijuana occasionally. He has dealt with depression for a number of years. Brother has schizophrenia and manic behavior.  Past Medical History  Diagnosis Date  . Asthma    History reviewed. No pertinent past surgical history. History reviewed. No pertinent family history. Social History  Substance Use Topics  . Smoking status: Current Every Day Smoker  . Smokeless tobacco: None  . Alcohol Use: No    Review of Systems  All other systems reviewed and are negative.     Allergies  Penicillins  Home Medications   Prior to Admission medications   Medication Sig Start Date End Date Taking? Authorizing Provider  azithromycin (ZITHROMAX) 250 MG tablet Take 1 tablet (250 mg total) by mouth daily. 08/21/14   Renne Crigler, PA-C  benzonatate (TESSALON) 100 MG capsule Take 1 capsule (100 mg total) by mouth every 8 (eight) hours. 08/21/14   Renne Crigler, PA-C  guaiFENesin (ROBITUSSIN) 100 MG/5ML SOLN Take 5 mLs by mouth every 4 (four) hours as needed for cough or to loosen phlegm.    Historical Provider, MD  HYDROcodone-homatropine (HYCODAN) 5-1.5 MG/5ML syrup Take 5 mLs by mouth every 6 (six) hours as needed for cough. 08/21/14   Renne Crigler, PA-C  ibuprofen (ADVIL,MOTRIN) 200 MG tablet Take 400 mg by mouth every 4 (four) hours as needed (for pain).    Historical Provider, MD   BP 127/90 mmHg  Pulse 99  Temp(Src) 97.4 F (36.3 C) (Oral)  Resp 18  SpO2 100% Physical Exam  Constitutional: He is oriented to person, place, and time. He appears  well-developed and well-nourished.  HENT:  Head: Normocephalic and atraumatic.  Eyes: Conjunctivae and EOM are normal. Pupils are equal, round, and reactive to light.  Neck: Normal range of motion. Neck supple.  Cardiovascular: Normal rate and regular rhythm.   Pulmonary/Chest: Effort normal and breath sounds normal.  Abdominal: Soft. Bowel sounds are normal.  Musculoskeletal: Normal range of motion.  Neurological: He is alert and oriented to person, place, and time.  Skin: Skin is warm and dry.  Psychiatric:  Flat affect, depressed  Nursing note and vitals reviewed.   ED Course  Procedures (including critical care time) Labs Review Labs Reviewed  COMPREHENSIVE METABOLIC PANEL - Abnormal; Notable for the following:    Total Protein 8.3 (*)    Albumin 5.2 (*)    All other components within normal limits  ACETAMINOPHEN LEVEL - Abnormal; Notable for the following:    Acetaminophen (Tylenol), Serum <10 (*)    All other components within normal limits  CBC - Abnormal; Notable for the following:    WBC 14.4 (*)    All other components within normal limits  URINE RAPID DRUG SCREEN, HOSP PERFORMED - Abnormal; Notable for the following:    Benzodiazepines POSITIVE (*)    All other components within normal limits  ETHANOL  SALICYLATE LEVEL    Imaging Review No results found. I have personally reviewed and evaluated these images and lab results as part of my medical decision-making.   EKG Interpretation None      MDM  Final diagnoses:  Depression    Patient is depressed with suicidal ideation. Will pain behavioral health consult.    Donnetta Hutching, MD 06/13/15 [phone]

## 2015-06-13 NOTE — ED Notes (Signed)
Pt states he had no where else to go and that he started thinking about killing himself. Has no plan, no thoughts of harming anyone else, no medical complaints.

## 2015-06-14 ENCOUNTER — Encounter (HOSPITAL_COMMUNITY): Payer: Self-pay

## 2015-06-14 DIAGNOSIS — G4700 Insomnia, unspecified: Secondary | ICD-10-CM | POA: Diagnosis present

## 2015-06-14 DIAGNOSIS — F3181 Bipolar II disorder: Secondary | ICD-10-CM

## 2015-06-14 DIAGNOSIS — J45909 Unspecified asthma, uncomplicated: Secondary | ICD-10-CM | POA: Diagnosis present

## 2015-06-14 DIAGNOSIS — F4323 Adjustment disorder with mixed anxiety and depressed mood: Secondary | ICD-10-CM

## 2015-06-14 DIAGNOSIS — F29 Unspecified psychosis not due to a substance or known physiological condition: Secondary | ICD-10-CM | POA: Diagnosis present

## 2015-06-14 DIAGNOSIS — F17290 Nicotine dependence, other tobacco product, uncomplicated: Secondary | ICD-10-CM | POA: Diagnosis present

## 2015-06-14 DIAGNOSIS — F250 Schizoaffective disorder, bipolar type: Secondary | ICD-10-CM | POA: Diagnosis present

## 2015-06-14 DIAGNOSIS — F410 Panic disorder [episodic paroxysmal anxiety] without agoraphobia: Secondary | ICD-10-CM | POA: Diagnosis present

## 2015-06-14 DIAGNOSIS — Z818 Family history of other mental and behavioral disorders: Secondary | ICD-10-CM | POA: Diagnosis not present

## 2015-06-14 DIAGNOSIS — Z590 Homelessness: Secondary | ICD-10-CM | POA: Diagnosis not present

## 2015-06-14 DIAGNOSIS — R45851 Suicidal ideations: Secondary | ICD-10-CM | POA: Diagnosis present

## 2015-06-14 DIAGNOSIS — Z79899 Other long term (current) drug therapy: Secondary | ICD-10-CM | POA: Diagnosis not present

## 2015-06-14 NOTE — Progress Notes (Signed)
Nursing Shift Note:  Patient stating to Nurse that he is very depressed about being homeless and has tried calling "everybody I know but no one's gonna help me". Pt also telling Nurse that he does not feel helpless but does feel hopeless with extreme anxiety and worry over where he will be going and what will happen to him. States he is "afraid the same feelings are gonna come right back". Nurse asking which feelings he is referring to and pt stating "like, hurting myself" and further states "beyond the cutting and the burning". States he had fleeting thoughts of walking into the road when he was walking to [facility] on previous day but does not have any specific plan. Patient does contract for safety on the Unit in that he agrees to alert staff should he feel he is going to self harm prior to any such action. Pt remains safe on Unit; is currently resting in bed with eyes closed, respirations unlabored.

## 2015-06-14 NOTE — Tx Team (Signed)
Initial Interdisciplinary Treatment Plan   PATIENT STRESSORS: Financial difficulties Loss of support, housing with mother Substance abuse   PATIENT STRENGTHS: Ability for insight Average or above average intelligence Communication skills General fund of knowledge Motivation for treatment/growth   PROBLEM LIST: Problem List/Patient Goals Date to be addressed Date deferred Reason deferred Estimated date of resolution  "I hadn't slept in 36 hours" 06/14/2015      "I hear voices" 06/14/2015     "homeless" 06/14/2015     "I was in a bad place, I was thinking of hurting myself" 06/14/2015                                    DISCHARGE CRITERIA:  Ability to meet basic life and health needs Improved stabilization in mood, thinking, and/or behavior Safe-care adequate arrangements made Verbal commitment to aftercare and medication compliance  PRELIMINARY DISCHARGE PLAN: Outpatient therapy Placement in alternative living arrangements  PATIENT/FAMIILY INVOLVEMENT: This treatment plan has been presented to and reviewed with the patient, Shawn Koch .The patient and family have been given the opportunity to ask questions and make suggestions.  Aurora Mask 06/14/2015, 6:22 PM

## 2015-06-14 NOTE — Progress Notes (Signed)
[facility] MD Progress Note  06/14/2015 10:53 AM Shawn Koch  MRN:  [id number] Subjective:  Patient reports " I am feeling better now, than when I came in."  Objective: Shawn Koch is awake, alert and oriented X4 , found resting on the observation unite.  Denies suicidal or homicidal ideation. Patient states that he is mostly depressed with passive suicidal ideations. Patient reports past history of self harm. Denies auditory or visual hallucination and does not appear to be responding to internal stimuli. Patient reports he is medication compliant without mediation side effects. Report he will use today to make other arrangements to discharging home. States his depression 5/10. Patient states his mood is improving with rest. Reports good appetite. States that he rested well throughout the night. Support, encouragement and reassurance was provided.   Principal Problem: Adjustment disorder with depressed mood Diagnosis:   Patient Active Problem List   Diagnosis Date Noted  . Bipolar 2 disorder, major depressive episode ([place]) [F31.81]   . Adjustment disorder with mixed anxiety and depressed mood [F43.23] 06/13/2015  . Adjustment disorder with depressed mood [F43.21] 06/13/2015   Total Time spent with patient: 30 minutes  Past Psychiatric History:   Past Medical History:  Past Medical History  Diagnosis Date  . Asthma    History reviewed. No pertinent past surgical history. Family History: History reviewed. No pertinent family history. Family Psychiatric  History: See Above Social History:  History  Alcohol Use No     History  Drug Use Not on file    Social History   Social History  . Marital Status: Single    Spouse Name: N/A  . Number of Children: N/A  . Years of Education: N/A   Social History Main Topics  . Smoking status: Current Every Day Smoker  . Smokeless tobacco: None  . Alcohol Use: No  . Drug Use: None  . Sexual Activity: Not Asked   Other Topics Concern  .  None   Social History Narrative   Additional Social History:    History of alcohol / drug use?:  (".) Longest period of sobriety (when/how long): these past 8 years Name of Substance 1: Cocaine                  Sleep: Fair  Appetite:  Fair  Current Medications: Current Facility-Administered Medications  Medication Dose Route Frequency Provider Last Rate Last Dose  . acetaminophen (TYLENOL) tablet 650 mg  650 mg Oral Q6H PRN Patrecia Pour, NP      . alum & mag hydroxide-simeth (MAALOX/MYLANTA) 200-200-20 MG/5ML suspension 30 mL  30 mL Oral Q4H PRN Patrecia Pour, NP      . FLUoxetine (PROZAC) capsule 20 mg  20 mg Oral QPM Laverle Hobby, PA-C   20 mg at 06/13/15 2306  . magnesium hydroxide (MILK OF MAGNESIA) suspension 30 mL  30 mL Oral Daily PRN Patrecia Pour, NP      . risperiDONE (RISPERDAL) tablet 3 mg  3 mg Oral QHS Laverle Hobby, PA-C   3 mg at 06/13/15 2304  . traZODone (DESYREL) tablet 50 mg  50 mg Oral QHS,MR X 1 Derrill Center, NP   50 mg at 06/13/15 2303    Lab Results:  Results for orders placed or performed during the hospital encounter of 06/13/15 (from the past 48 hour(s))  Urine rapid drug screen (hosp performed) (Not at [facility])     Status: Abnormal   Collection Time: 06/13/15  8:21 AM  Result Value Ref Range   Opiates NONE DETECTED NONE DETECTED   Cocaine NONE DETECTED NONE DETECTED   Benzodiazepines POSITIVE (A) NONE DETECTED   Amphetamines NONE DETECTED NONE DETECTED   Tetrahydrocannabinol NONE DETECTED NONE DETECTED   Barbiturates NONE DETECTED NONE DETECTED    Comment:        DRUG SCREEN FOR MEDICAL PURPOSES ONLY.  IF CONFIRMATION IS NEEDED FOR ANY PURPOSE, NOTIFY LAB WITHIN 5 DAYS.        LOWEST DETECTABLE LIMITS FOR URINE DRUG SCREEN Drug Class       Cutoff (ng/mL) Amphetamine      1000 Barbiturate      200 Benzodiazepine   161 Tricyclics       096 Opiates          300 Cocaine          300 THC              50   Comprehensive  metabolic panel     Status: Abnormal   Collection Time: 06/13/15  8:35 AM  Result Value Ref Range   Sodium 144 135 - 145 mmol/L   Potassium 3.6 3.5 - 5.1 mmol/L   Chloride 106 101 - 111 mmol/L   CO2 26 22 - 32 mmol/L   Glucose, Bld 92 65 - 99 mg/dL   BUN 11 6 - 20 mg/dL   Creatinine, Ser 1.11 0.61 - 1.24 mg/dL   Calcium 9.7 8.9 - 10.3 mg/dL   Total Protein 8.3 (H) 6.5 - 8.1 g/dL   Albumin 5.2 (H) 3.5 - 5.0 g/dL   AST 37 15 - 41 U/L   ALT 29 17 - 63 U/L   Alkaline Phosphatase 80 38 - 126 U/L   Total Bilirubin 0.8 0.3 - 1.2 mg/dL   GFR calc non Af Amer >60 >60 mL/min   GFR calc Af Amer >60 >60 mL/min    Comment: (NOTE) The eGFR has been calculated using the CKD EPI equation. This calculation has not been validated in all clinical situations. eGFR's persistently <60 mL/min signify possible Chronic Kidney Disease.    Anion gap 12 5 - 15  Ethanol (ETOH)     Status: None   Collection Time: 06/13/15  8:35 AM  Result Value Ref Range   Alcohol, Ethyl (B) <5 <5 mg/dL    Comment:        LOWEST DETECTABLE LIMIT FOR SERUM ALCOHOL IS 5 mg/dL FOR MEDICAL PURPOSES ONLY   Salicylate level     Status: None   Collection Time: 06/13/15  8:35 AM  Result Value Ref Range   Salicylate Lvl <0.4 2.8 - 30.0 mg/dL  Acetaminophen level     Status: Abnormal   Collection Time: 06/13/15  8:35 AM  Result Value Ref Range   Acetaminophen (Tylenol), Serum <10 (L) 10 - 30 ug/mL    Comment:        THERAPEUTIC CONCENTRATIONS VARY SIGNIFICANTLY. A RANGE OF 10-30 ug/mL MAY BE AN EFFECTIVE CONCENTRATION FOR MANY PATIENTS. HOWEVER, SOME ARE BEST TREATED AT CONCENTRATIONS OUTSIDE THIS RANGE. ACETAMINOPHEN CONCENTRATIONS >150 ug/mL AT 4 HOURS AFTER INGESTION AND >50 ug/mL AT 12 HOURS AFTER INGESTION ARE OFTEN ASSOCIATED WITH TOXIC REACTIONS.   CBC     Status: Abnormal   Collection Time: 06/13/15  8:35 AM  Result Value Ref Range   WBC 14.4 (H) 4.0 - 10.5 K/uL   RBC 5.11 4.22 - 5.81 MIL/uL    Hemoglobin 15.7 13.0 - 17.0 g/dL  HCT 48.3 39.0 - 52.0 %   MCV 94.5 78.0 - 100.0 fL   MCH 30.7 26.0 - 34.0 pg   MCHC 32.5 30.0 - 36.0 g/dL   RDW 13.1 11.5 - 15.5 %   Platelets 202 150 - 400 K/uL    Blood Alcohol level:  Lab Results  Component Value Date   ETH <5 06/13/2015    Physical Findings: AIMS: Facial and Oral Movements Muscles of Facial Expression: None, normal Lips and Perioral Area: None, normal Jaw: None, normal Tongue: None, normal,Extremity Movements Upper (arms, wrists, hands, fingers): None, normal Lower (legs, knees, ankles, toes): None, normal, Trunk Movements Neck, shoulders, hips: None, normal, Overall Severity Severity of abnormal movements (highest score from questions above): None, normal Incapacitation due to abnormal movements: None, normal Patient's awareness of abnormal movements (rate only patient's report): No Awareness, Dental Status Current problems with teeth and/or dentures?: No Does patient usually wear dentures?: No  CIWA:  CIWA-Ar Total: 5 COWS:  COWS Total Score: 3  Musculoskeletal: Strength & Muscle Tone: within normal limits Gait & Station: normal Patient leans: N/A  Psychiatric Specialty Exam: Review of Systems  Psychiatric/Behavioral: Positive for depression and substance abuse. Negative for hallucinations. Suicidal ideas: passive. The patient is nervous/anxious. The patient does not have insomnia.   All other systems reviewed and are negative.   Blood pressure 105/68, pulse 93, temperature 98.3 F (36.8 C), temperature source Oral, resp. rate 18, height 5' 6"  (1.676 m), weight 81.874 kg (180 lb 8 oz), SpO2 98 %.Body mass index is 29.15 kg/(m^2).  General Appearance: Casual Paper scrubs  Eye Contact::  Good  Speech:  Clear and Coherent  Volume:  Normal  Mood:  Anxious and Depressed  Affect:  Depressed  Thought Process:  Coherent  Orientation:  Full (Time, Place, and Person)  Thought Content:  Hallucinations: None  Suicidal  Thoughts:  Yes.  without intent/plan Passive thoughts   Homicidal Thoughts:  No  Memory:  Immediate;   Fair Recent;   Fair Remote;   Fair  Judgement:  Fair  Insight:  Fair  Psychomotor Activity:  Restlessness  Concentration:  Fair  Recall:  Roel Cluck of Knowledge:Fair  Language: Fair  Akathisia:  No  Handed:  Right  AIMS (if indicated):     Assets:  Desire for Improvement Resilience  ADL's:  Intact  Cognition: WNL  Sleep:       I agree with current treatment plan on 06/14/2015, Patient seen face-to-face for psychiatric evaluation follow-up, chart reviewed and case discussed with the MD [place] and Treatment team. Reviewed the information documented and agree with the treatment plan.   Treatment Plan Summary: Daily contact with patient to assess and evaluate symptoms and progress in treatment and Medication management  Continue Trazodone 50 mg PO QHS for insomnia  Continue Prozac and Risperdal for mood stabilization  Disposition:   Counselor: working on disposition for patient to follow-up the [facility] and Psychiatrist appt. Before discharge. 06/15/2015  Derrill Center, NP 06/14/2015, 10:53 AM  I reviewed chart and agreed with the findings and treatment Plan.  Berniece Andreas, MD

## 2015-06-14 NOTE — BH Assessment (Signed)
[facility] Assessment Progress Note Based on further patient observation and request by patient, Shawn Purpura NP was consulted and agreed patient met criteria for an inpatient admission and will be admitted to 503-1 accepting physician [place].

## 2015-06-14 NOTE — Progress Notes (Signed)
Adult Psychoeducational Group Note  Date:  06/14/2015 Time:  9:20 PM  Group Topic/Focus:  Wrap-Up Group:   The focus of this group is to help patients review their daily goal of treatment and discuss progress on daily workbooks.  Participation Level:  Active  Participation Quality:  Appropriate  Affect:  Appropriate  Cognitive:  Alert  Insight: Appropriate  Engagement in Group:  Engaged  Modes of Intervention:  Discussion  Additional Comments:  Patient goal for today was to transition from the OBS to the adult unit. On a scale between 1-10, (1=worse, 10=best) patient rated his day an 8.  Maanya Hippert L Elvis Laufer 06/14/2015, 9:20 PM

## 2015-06-14 NOTE — BHH Counselor (Signed)
Pt was seen by Donell Sievert, PA-C who determined that this pt could benefit from receiving Inpatient Treatment. Pt states that he currently does not have a therapist or a psychiatrist, but would like to be referred to [facility] OPT. Pt currently is homeless and does not have a plan. Pt sts that he does not have any family left in [place]. Pt is positive for depression and suicidal ideas. Pt plan is to continue with psychotropic medications, continue with obersavation, crises intervention and stabilization. TTS to evaluate in the AM to aid in disposition plans.    Ardelle Park, MA OBS Counselor
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
## 2015-06-14 NOTE — Progress Notes (Signed)
Pt continuously monitored on unit for safety except when in bathroom.

## 2015-06-14 NOTE — BH Assessment (Signed)
B[facility] Assessment Progress Note  Patient was seen this date by Melvyn Neth NP and Keoni Havey LCAS. Patient stated they were feeling better this date after sleeping for the first time in three days. Patient states they still having some S/I with no plan but cannot contract for safety. Patient has been started on a medication regimen (Risperdal , Trazadone  and Prozac ) while in the Observation Unit which he feels, is assisting him with his anxiety. Patient has no prior outpatient/inpatient Hx. and will continued to be monitored in the Observation Unit and re-evaluated on 06/15/15. Patient will also utilize this time to explore housing options (contacting friends, relatives). Patient will be referred on discharge to [facility] if unsuccessful finding housing. Patient will be encouraged to follow up with counseling and medication management through [facility].

## 2015-06-14 NOTE — H&P (Signed)
Psychiatric Admission Assessment Adult  Patient Identification: Shawn Koch MRN:  [id number] Date of Evaluation:  06/14/2015 Chief Complaint:  DEPRESSIVE DISORDER ANXIETY DISORDER Principal Diagnosis: Bipolar 2 Disorder with MDD moderate, Adjustment Disorder with Mixed Depression and Anxiety  Patient Active Problem List   Diagnosis Date Noted  . Bipolar 2 disorder, major depressive episode ([place]) [F31.81]   . Adjustment disorder with mixed anxiety and depressed mood [F43.23] 06/13/2015  . Adjustment disorder with depressed mood [F43.21] 06/13/2015   History of Present Illness: Shawn Koch is an 35 y.o. male. He presents to [facility] voluntarily brought by [facility]. Sts he called 44 today with a complaint of suicidal ideations, Identified triggers as catalyst og his depression and SI include  Recent eviction and lodd of his job.. Patient has felt suicidal "all my life". His suicidal thoughts have become more intrusive and frequent over the last 2 months. He has a hx of MDD dating back 20 years and hasn't been on psychotropics for ten years. He does not have a suicidal plan. He has no history of suicide attempts. He does report self mutilating behaviors such as burning his skin and cutting. Patient was also living with his mother up until few days ago. Sts they were evicted from their home because his mother lost her job. Patient left the home to stay in a hotel room. His mother left and went to live with a family member. Patient called his mother yesterday to ask if he could come stay with her and the family member. Patient's mother responded, "No I can't take care of you anymore and I moved away from [place]". Patient is now homeless.   Patient denies HI. He denies legal issues. He is currently calm and cooperative. He reports auditory hallucinations of voices calling him name. No command type hallucinations. No visual hallucinations.   Patient has a previous history of drug use. Most recent  use includes THC which was 1 month ago. He does not have a psychiatrist or therapist. No history of inpatient treatment.  Associated Signs/Symptoms:  Depression Symptoms:  depressed mood, anhedonia, feelings of worthlessness/guilt, suicidal thoughts without plan, (Hypo) Manic Symptoms:  n/a Anxiety Symptoms:  Excessive Worry, Psychotic Symptoms:  N/A PTSD Symptoms: Negative Total Time spent with patient: 30 minutes  Past Psychiatric History: ( See HPI )  Is the patient at risk to self? No.  Has the patient been a risk to self in the past 6 months? No.  Has the patient been a risk to self within the distant past? No.  Is the patient a risk to others? No.  Has the patient been a risk to others in the past 6 months? No.  Has the patient been a risk to others within the distant past? No.   Prior Inpatient Therapy:   Prior Outpatient Therapy:    Alcohol Screening: 1. How often do you have a drink containing alcohol?: Never 9. Have you or someone else been injured as a result of your drinking?: No 10. Has a relative or friend or a doctor or another health worker been concerned about your drinking or suggested you cut down?: No Alcohol Use Disorder Identification Test Final Score (AUDIT): 0 Brief Intervention: AUDIT score less than 7 or less-screening does not suggest unhealthy drinking-brief intervention not indicated Substance Abuse History in the last 12 months:  Yes.   Consequences of Substance Abuse: Family Consequences:  estranged family members Previous Psychotropic Medications: Yes  Psychological Evaluations: Yes  Past Medical History:  Past Medical History  Diagnosis Date  . Asthma    History reviewed. No pertinent past surgical history. Family History: History reviewed. No pertinent family history. Family Psychiatric  History: MDD, Schizophrenia Tobacco Screening:Positive Smokeless Tobacco Social History:  History  Alcohol Use No     History  Drug Use Not on file     Additional Social History:      History of alcohol / drug use?:  (".) Longest period of sobriety (when/how long): these past 8 years Name of Substance 1: Cocaine                  Allergies:   Allergies  Allergen Reactions  . 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   Marland Kitchen Penicillins Hives    Has patient had a PCN reaction causing immediate rash, facial/tongue/throat swelling, SOB or lightheadedness with hypotension: Unknown  Has patient had a PCN reaction causing severe rash involving mucus membranes or skin necrosis: Unknown  Has patient had a PCN reaction that required hospitalization: No  Has patient had a PCN reaction occurring within the last 10 years: Yes  If all of the above answers are "NO", then may proceed with Cephalosporin use.    Lab Results:  Results for orders placed or performed during the hospital encounter of 06/13/15 (from the past 48 hour(s))  Urine rapid drug screen (hosp performed) (Not at [facility])     Status: Abnormal   Collection Time: 06/13/15  8:21 AM  Result Value Ref Range   Opiates NONE DETECTED NONE DETECTED   Cocaine NONE DETECTED NONE DETECTED   Benzodiazepines POSITIVE (A) NONE DETECTED   Amphetamines NONE DETECTED NONE DETECTED   Tetrahydrocannabinol NONE DETECTED NONE DETECTED   Barbiturates NONE DETECTED NONE DETECTED    Comment:        DRUG SCREEN FOR MEDICAL PURPOSES ONLY.  IF CONFIRMATION IS NEEDED FOR ANY PURPOSE, NOTIFY LAB WITHIN 5 DAYS.        LOWEST DETECTABLE LIMITS FOR URINE DRUG SCREEN Drug Class       Cutoff (ng/mL) Amphetamine      1000 Barbiturate      200 Benzodiazepine   154 Tricyclics       008 Opiates           300 Cocaine          300 THC              50   Comprehensive metabolic panel     Status: Abnormal   Collection Time: 06/13/15  8:35 AM  Result Value Ref Range   Sodium 144 135 - 145 mmol/L   Potassium 3.6 3.5 - 5.1 mmol/L   Chloride 106 101 - 111 mmol/L   CO2 26 22 - 32 mmol/L   Glucose, Bld 92 65 - 99 mg/dL   BUN 11 6 - 20 mg/dL   Creatinine, Ser 1.11 0.61 - 1.24 mg/dL   Calcium 9.7 8.9 - 10.3 mg/dL   Total Protein 8.3 (H) 6.5 - 8.1 g/dL   Albumin 5.2 (H) 3.5 - 5.0 g/dL   AST 37 15 - 41 U/L   ALT 29 17 - 63 U/L  Alkaline Phosphatase 80 38 - 126 U/L   Total Bilirubin 0.8 0.3 - 1.2 mg/dL   GFR calc non Af Amer >60 >60 mL/min   GFR calc Af Amer >60 >60 mL/min    Comment: (NOTE) The eGFR has been calculated using the CKD EPI equation. This calculation has not been validated in all clinical situations. eGFR's persistently <60 mL/min signify possible Chronic Kidney Disease.    Anion gap 12 5 - 15  Ethanol (ETOH)     Status: None   Collection Time: 06/13/15  8:35 AM  Result Value Ref Range   Alcohol, Ethyl (B) <5 <5 mg/dL    Comment:        LOWEST DETECTABLE LIMIT FOR SERUM ALCOHOL IS 5 mg/dL FOR MEDICAL PURPOSES ONLY   Salicylate level     Status: None   Collection Time: 06/13/15  8:35 AM  Result Value Ref Range   Salicylate Lvl <5.8 2.8 - 30.0 mg/dL  Acetaminophen level     Status: Abnormal   Collection Time: 06/13/15  8:35 AM  Result Value Ref Range   Acetaminophen (Tylenol), Serum <10 (L) 10 - 30 ug/mL    Comment:        THERAPEUTIC CONCENTRATIONS VARY SIGNIFICANTLY. A RANGE OF 10-30 ug/mL MAY BE AN EFFECTIVE CONCENTRATION FOR MANY PATIENTS. HOWEVER, SOME ARE BEST TREATED AT CONCENTRATIONS OUTSIDE THIS RANGE. ACETAMINOPHEN CONCENTRATIONS >150 ug/mL AT 4 HOURS AFTER INGESTION AND >50 ug/mL AT 12 HOURS AFTER INGESTION ARE OFTEN ASSOCIATED WITH TOXIC REACTIONS.   CBC     Status: Abnormal   Collection Time: 06/13/15  8:35 AM  Result Value Ref Range    WBC 14.4 (H) 4.0 - 10.5 K/uL   RBC 5.11 4.22 - 5.81 MIL/uL   Hemoglobin 15.7 13.0 - 17.0 g/dL   HCT 48.3 39.0 - 52.0 %   MCV 94.5 78.0 - 100.0 fL   MCH 30.7 26.0 - 34.0 pg   MCHC 32.5 30.0 - 36.0 g/dL   RDW 13.1 11.5 - 15.5 %   Platelets 202 150 - 400 K/uL    Blood Alcohol level:  Lab Results  Component Value Date   ETH <5 09/98/3382    Metabolic Disorder Labs:  No results found for: HGBA1C, MPG No results found for: PROLACTIN No results found for: CHOL, TRIG, HDL, CHOLHDL, VLDL, LDLCALC  Current Medications: Current Facility-Administered Medications  Medication Dose Route Frequency Provider Last Rate Last Dose  . acetaminophen (TYLENOL) tablet 650 mg  650 mg Oral Q6H PRN Patrecia Pour, NP      . alum & mag hydroxide-simeth (MAALOX/MYLANTA) 200-200-20 MG/5ML suspension 30 mL  30 mL Oral Q4H PRN Patrecia Pour, NP      . FLUoxetine (PROZAC) capsule 20 mg  20 mg Oral QPM Laverle Hobby, PA-C   20 mg at 06/13/15 2306  . magnesium hydroxide (MILK OF MAGNESIA) suspension 30 mL  30 mL Oral Daily PRN Patrecia Pour, NP      . risperiDONE (RISPERDAL) tablet 3 mg  3 mg Oral QHS Laverle Hobby, PA-C   3 mg at 06/13/15 2304  . traZODone (DESYREL) tablet 50 mg  50 mg Oral QHS,MR X 1 Derrill Center, NP   50 mg at 06/13/15 2303   PTA Medications: Prescriptions prior to admission  Medication Sig Dispense Refill Last Dose  . albuterol (PROVENTIL HFA;VENTOLIN HFA) 108 (90 Base) MCG/ACT inhaler Inhale 2 puffs into the lungs every 4 (four) hours as needed for wheezing or shortness  of breath (asthma symptoms).   06/05/2015 at 1600  . diphenhydrAMINE (BENADRYL) 25 MG tablet Take 50 mg by mouth at bedtime as needed for sleep.   06/11/2015    Musculoskeletal: Strength & Muscle Tone: within normal limits Gait & Station: normal Patient leans: N/A  Psychiatric Specialty Exam: Physical Exam  Nursing note and vitals reviewed. Constitutional: He is oriented to person, place, and time. He  appears well-developed and well-nourished.  HENT:  Head: Normocephalic.  Neurological: He is alert and oriented to person, place, and time. No cranial nerve deficit.  Skin: Skin is warm and dry.    Review of Systems  Psychiatric/Behavioral: Positive for depression and suicidal ideas. Negative for hallucinations and substance abuse. The patient is nervous/anxious.   All other systems reviewed and are negative.   Blood pressure 130/90, pulse 87, temperature 97.4 F (36.3 C), temperature source Oral, resp. rate 18, height 5' 6"  (1.676 m), weight 81.874 kg (180 lb 8 oz), SpO2 95 %.Body mass index is 29.15 kg/(m^2).  General Appearance: Disheveled  Eye Contact::  Poor  Speech:  Pressured  Volume:  Increased  Mood:  Anxious  Affect:  Full Range  Thought Process:  Circumstantial  Orientation:  Full (Time, Place, and Person)  Thought Content:  Negative  Suicidal Thoughts:  Yes.  without intent/plan  Homicidal Thoughts:  No  Memory:  Immediate;   Negative  Judgement:  Poor  Insight:  Lacking  Psychomotor Activity:  Restlessness  Concentration:  Poor  Recall:  Poor  Fund of Knowledge:Negative  Language: Negative  Akathisia:  Negative  Handed:  Right  AIMS (if indicated):     Assets:  Resilience  ADL's:  Intact  Cognition: WNL  Sleep:        Treatment Plan Summary: Plan Started Psychotropic medications, continue with obersavation, crises intervention and stabilization. TTS to eva,l in am to aid in disposition plans  Observation Level/Precautions:  Continuous Observation  Laboratory:    Psychotherapy:    Medications:    Consultations:    Discharge Concerns:    Estimated LOS:24 -48  Other:       SIMON,SPENCER E, PA-C 3/1/201712:45 AM Patient admitted to [facility] H observation unit for stabilization and treatment

## 2015-06-15 ENCOUNTER — Encounter (HOSPITAL_COMMUNITY): Payer: Self-pay | Admitting: Psychiatry

## 2015-06-15 DIAGNOSIS — F250 Schizoaffective disorder, bipolar type: Principal | ICD-10-CM

## 2015-06-15 MED ORDER — ALBUTEROL SULFATE HFA 108 (90 BASE) MCG/ACT IN AERS
2.0000 | INHALATION_SPRAY | Freq: Four times a day (QID) | RESPIRATORY_TRACT | Status: DC | PRN
  Administered 2015-06-15: 2 via RESPIRATORY_TRACT
  Filled 2015-06-15: qty 6.7

## 2015-06-15 MED ORDER — OLANZAPINE 10 MG IM SOLR: 5.0000 mg | Freq: Three times a day (TID) | INTRAMUSCULAR | Status: DC | PRN

## 2015-06-15 MED ORDER — CHLORDIAZEPOXIDE HCL 25 MG PO CAPS: 25.0000 mg | ORAL_CAPSULE | Freq: Four times a day (QID) | ORAL | Status: DC | PRN

## 2015-06-15 MED ORDER — OLANZAPINE 10 MG PO TBDP: 10.0000 mg | ORAL_TABLET | Freq: Three times a day (TID) | ORAL | Status: DC | PRN

## 2015-06-15 MED ORDER — DIVALPROEX SODIUM ER 500 MG PO TB24
750.0000 mg | ORAL_TABLET | Freq: Every day | ORAL | Status: DC
  Administered 2015-06-15 – 2015-06-19 (×5): 750 mg via ORAL
  Filled 2015-06-15 (×7): qty 1

## 2015-06-15 MED ORDER — BENZTROPINE MESYLATE 1 MG PO TABS
1.0000 mg | ORAL_TABLET | Freq: Every day | ORAL | Status: DC
  Administered 2015-06-15 – 2015-06-19 (×5): 1 mg via ORAL
  Filled 2015-06-15 (×5): qty 1
  Filled 2015-06-15: qty 7
  Filled 2015-06-15 (×2): qty 1

## 2015-06-15 NOTE — [facility] Suicide Risk Assessment (Signed)
[facility] INPATIENT:  Family/Significant Other Suicide Prevention Education  Suicide Prevention Education:  Education Completed; Arline Asp (mom) [phone], has been identified by the patient as the family member/significant other with whom the patient will be residing, and identified as the person(s) who will aid the patient in the event of a mental health crisis (suicidal ideations/suicide attempt).  With written consent from the patient, the family member/significant other has been provided the following suicide prevention education, prior to the and/or following the discharge of the patient.  The suicide prevention education provided includes the following:  Suicide risk factors  Suicide prevention and interventions  National Suicide Hotline telephone number  [facility] assessment telephone number  [facility] Emergency Assistance 911  [facility] and/or Residential Mobile Crisis Unit telephone number  Request made of family/significant other to:  Remove weapons (e.g., guns, rifles, knives), all items previously/currently identified as safety concern.    Remove drugs/medications (over-the-counter, prescriptions, illicit drugs), all items previously/currently identified as a safety concern.  The family member/significant other verbalizes understanding of the suicide prevention education information provided.  The family member/significant other agrees to remove the items of safety concern listed above.  Pt's mother states that pt has known that they were going to get evicted for several months but made no attempts to get a job or to find another place to stay. Mother is worried about pt but states that she does not have the means to take care of pt because she can barely take care of herself. Pt's mother will contact other family members to see if she can find somewhere for pt to stay.  Jonathon Jordan 06/15/2015, 3:51 PM
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
## 2015-06-15 NOTE — [facility] Group Notes (Signed)
[facility] Group Notes:  (Nursing/MHT/Case Management/Adjunct)  Date:  06/15/2015  Time:  10:15 am  Type of Therapy:  Nurse Education  Participation Level:  Active  Participation Quality:  Appropriate and Attentive  Affect:  Appropriate  Cognitive:  Appropriate  Insight:  Improving  Engagement in Group:  Developing/Improving and Engaged  Modes of Intervention:  Discussion and Education  Summary of Progress/Problems:  Group topic was Leisure and lifestyle changes.  Discussed making positive changes with leisure activities, trying to focus on positives, make changes with our negative feelings/ideas and importance of daily goals.  Shawn Koch states he likes to listen to music and walking for fun.  He feels that a positive attribute is being Excitement.   Norm Parcel Jolleen Seman 06/15/2015, 11:46 AM
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
## 2015-06-15 NOTE — Progress Notes (Signed)
D: Patient alert and oriented x 4. Patient denies pain/SI/HI/AVH. Patient states he is happy to be over here in the unit and not in OBS anymore.  A: Staff to monitor Q 15 mins for safety. Encouragement and support offered. Scheduled medications administered per orders. R: Patient remains safe on the unit. Patient attended group tonight. Patient visible on hte unit and interacting with peers. Patient taking administered medications.

## 2015-06-15 NOTE — [facility] Group Notes (Signed)
[facility] LCSW Group Therapy 06/15/2015 1:15pm  Type of Therapy: Group Therapy- Balance in Life  Participation Level: Active   Description of the Group:  The topic for group was balance in life. Today's group focused on defining balance in one's own words, identifying things that can knock one off balance, and exploring healthy ways to maintain balance in life. Group members were asked to provide an example of a time when they felt off balance, describe how they handled that situation,and process healthier ways to regain balance in the future. Group members were asked to share the most important tool for maintaining balance that they learned while at [facility] and how they plan to apply this method after discharge.  Summary of Patient Progress Pt chose an optical illusion as a representation of balance in his life because he feels like he often portrays balance in his life even when he feels unbalanced.     Therapeutic Modalities:   Cognitive Behavioral Therapy Solution-Focused Therapy Assertiveness Training   Chad Cordial, LCSWA 06/15/2015 2:01 PM

## 2015-06-15 NOTE — [facility] Suicide Risk Assessment (Signed)
[facility] Admission Suicide Risk Assessment   Nursing information obtained from:  Patient Demographic factors:  Male, Adolescent or young adult, Low socioeconomic status, Living alone, Unemployed Current Mental Status:  Suicidal ideation indicated by patient, Self-harm thoughts, Self-harm behaviors Loss Factors:  Decrease in vocational status, Loss of significant relationship, Financial problems / change in socioeconomic status Historical Factors:  Family history of mental illness or substance abuse Risk Reduction Factors:  NA  Total Time spent with patient: 30 minutes Principal Problem: Schizoaffective disorder, bipolar type (HCC) Diagnosis:   Patient Active Problem List   Diagnosis Date Noted  . Schizoaffective disorder, bipolar type (HCC) [F25.0] 06/15/2015   Subjective Data:Pt states " I have mood sx, anger issues and I hear voices - some one calling my name all the time.'  Continued Clinical Symptoms:  Alcohol Use Disorder Identification Test Final Score (AUDIT): 0 The "Alcohol Use Disorders Identification Test", Guidelines for Use in Primary Care, Second Edition.  World Science writer [facility]). Score between 0-7:  no or low risk or alcohol related problems. Score between 8-15:  moderate risk of alcohol related problems. Score between 16-19:  high risk of alcohol related problems. Score 20 or above:  warrants further diagnostic evaluation for alcohol dependence and treatment.   CLINICAL FACTORS:   Unstable or Poor Therapeutic Relationship Previous Psychiatric Diagnoses and Treatments   Musculoskeletal: Strength & Muscle Tone: within normal limits Gait & Station: normal Patient leans: N/A  Psychiatric Specialty Exam: Review of Systems  Psychiatric/Behavioral: Positive for depression and hallucinations. The patient is nervous/anxious and has insomnia.   All other systems reviewed and are negative.   Blood pressure 111/69, pulse 78, temperature 98.4 F (36.9 C), temperature  source Oral, resp. rate 18, height  (1.676 m), weight 81.874 kg (180 lb 8 oz), SpO2 95 %.Body mass index is 29.15 kg/(m^2).  General Appearance: Fairly Groomed  Patent attorney::  Fair  Speech:  Normal Rate  Volume:  Normal  Mood:  Anxious  Affect:  Congruent  Thought Process:  Coherent  Orientation:  Full (Time, Place, and Person)  Thought Content:  Hallucinations: Auditory  Suicidal Thoughts:  No  Homicidal Thoughts:  No  Memory:  Immediate;   Fair Recent;   Fair Remote;   Fair  Judgement:  Impaired  Insight:  Shallow  Psychomotor Activity:  Restlessness  Concentration:  Poor  Recall:  [employer] of Knowledge:Fair  Language: Fair  Akathisia:  No  Handed:  Right  AIMS (if indicated):     Assets:  Desire for Improvement  Sleep:  Number of Hours: 6.75  Cognition: WNL  ADL's:  Intact    COGNITIVE FEATURES THAT CONTRIBUTE TO RISK:  Closed-mindedness, Polarized thinking and Thought constriction (tunnel vision)    SUICIDE RISK:   Moderate:  Frequent suicidal ideation with limited intensity, and duration, some specificity in terms of plans, no associated intent, good self-control, limited dysphoria/symptomatology, some risk factors present, and identifiable protective factors, including available and accessible social support.  PLAN OF CARE: Patient will benefit from inpatient treatment and stabilization.  Estimated length of stay is 5-7 days.  Reviewed past medical records,treatment plan.  Case discussed with Velna Hatchet May NP , please also see H&p. Will start Risperidone 3 mg po qhs for psychosis/mood sx. Will start Depakote ER 750 mg po qhs for mood sx. Depakote level in 5 days. Will continue Trazodone 50 mg po qhs for sleep. Will DC Prozac. Start CIWA protocol for ?BZD abuse - UDS pos. Will continue to  monitor vitals ,medication compliance and treatment side effects while patient is here.  Will monitor for medical issues as well as call consult as needed.  Reviewed labs  ,will order as needed.  CSW will start working on disposition.  Patient to participate in therapeutic milieu .       I certify that inpatient services furnished can reasonably be expected to improve the patient's condition.   Taz Vanness, MD 06/15/2015, 1:08 PM

## 2015-06-15 NOTE — Progress Notes (Signed)
Adult Psychoeducational Group Note  Date:  06/15/2015 Time:  9:07 PM  Group Topic/Focus:  Wrap-Up Group:   The focus of this group is to help patients review their daily goal of treatment and discuss progress on daily workbooks.  Participation Level:  Active  Participation Quality:  Appropriate  Affect:  Appropriate  Cognitive:  Appropriate  Insight: Appropriate  Engagement in Group:  Engaged  Modes of Intervention:  Discussion  Additional Comments:  The patient expressed that he attended group.The patient also said that he rates his day a 9 which is good.  Octavio Manns 06/15/2015, 9:07 PM

## 2015-06-15 NOTE — Progress Notes (Signed)
DAR Note: Shawn Koch has been visible on the unit.  He denies SI/HI or A/V hallucinations.  He gets up for meals and groups.  Minimal interaction with peers.  He is pleasant and cooperative.  Alert and oriented X 3.  He denies any pain or discomfort and appears to be in no physical distress.  He completed his self inventory and repots that his depression and hopelessness are 4/10 and his anxiety is 5/10.  His goal for today is "transition to inpatient care" and he will accomplish his goal by "attending all group activities."  Encouraged continued participation in group and unit activities.  Q 15 minute checks maintained for safety.  We will continue to monitor the progress towards his goals.

## 2015-06-16 LAB — TSH: TSH: 1.028 u[IU]/mL (ref 0.350–4.500)

## 2015-06-16 NOTE — Progress Notes (Signed)
[name] has been very quiet, isolative to his room and not initiating conversation with anybody ( patient and / or staff) today. A He did ( finally) complete his daily assessment around 1300 today and on it he wrote he denies SI today and he rates his depression, hopelessness and anxiety " 2/1/4", respectively. R Safety in place. No self harm behaviors identified.

## 2015-06-16 NOTE — Progress Notes (Signed)
[facility] MD Progress Note  06/16/2015 11:31 AM Shawn Koch  MRN:  [id number] Subjective: Patient states ' I am a bit better.'  Objective:Shawn Koch, 35 years old, caucasian male , who is single, homeless, unemployed , who has a hx of depression unpsecified , was admitted for depression and  suicidal ideations.  Patient seen and chart reviewed.Discussed patient with treatment team.  Pt today seen as withdrawn , minimal eye contact , he continues to appear depressed, but states that his sx are improving on the current medictaions. Pt continues to have AH . Pt also with anger issues , but states he is able to cope with it better. Pt per staff has been compliant on medications, denies ADRS. Pt to be encouraged to attend groups and participate in milieu.      Principal Problem: Schizoaffective disorder, bipolar type (HCC) Diagnosis:   Patient Active Problem List   Diagnosis Date Noted  . Schizoaffective disorder, bipolar type (HCC) [F25.0] 06/15/2015   Total Time spent with patient: 30 minutes  Past Psychiatric History: as per H&p  Past Medical History:  Past Medical History  Diagnosis Date  . Asthma   . Depression    History reviewed. No pertinent past surgical history. Family History:  Family History  Problem Relation Age of Onset  . Schizophrenia Brother    Family Psychiatric  History: as per H&p Social History: single, homeless, unemployed. History  Alcohol Use No     History  Drug Use  . Yes  . Special: Marijuana    Social History   Social History  . Marital Status: Single    Spouse Name: N/A  . Number of Children: N/A  . Years of Education: N/A   Social History Main Topics  . Smoking status: Current Some Day Smoker    Types: Cigars  . Smokeless tobacco: None  . Alcohol Use: No  . Drug Use: Yes    Special: Marijuana  . Sexual Activity: Not Asked   Other Topics Concern  . None   Social History Narrative   Additional Social History:    History  of alcohol / drug use?:  (".) Longest period of sobriety (when/how long): these past 8 years Name of Substance 1: Cocaine                  Sleep: Fair  Appetite:  Fair  Current Medications: Current Facility-Administered Medications  Medication Dose Route Frequency Provider Last Rate Last Dose  . acetaminophen (TYLENOL) tablet 650 mg  650 mg Oral Q6H PRN Charm Rings, NP      . albuterol (PROVENTIL HFA;VENTOLIN HFA) 108 (90 Base) MCG/ACT inhaler 2 puff  2 puff Inhalation Q6H PRN Craige Cotta, MD   2 puff at 06/15/15 2149  . alum & mag hydroxide-simeth (MAALOX/MYLANTA) 200-200-20 MG/5ML suspension 30 mL  30 mL Oral Q4H PRN Charm Rings, NP      . benztropine (COGENTIN) tablet 1 mg  1 mg Oral QHS Jomarie Longs, MD   1 mg at 06/15/15 2149  . chlordiazePOXIDE (LIBRIUM) capsule 25 mg  25 mg Oral Q6H PRN Jomarie Longs, MD      . divalproex (DEPAKOTE ER) 24 hr tablet 750 mg  750 mg Oral Daily Adonis Brook, NP   750 mg at 06/16/15 0913  . magnesium hydroxide (MILK OF MAGNESIA) suspension 30 mL  30 mL Oral Daily PRN Charm Rings, NP      . OLANZapine zydis (ZYPREXA) disintegrating tablet 10 mg  10 mg Oral TID PRN Jomarie LongsSaramma Preslie Depasquale, MD       Or  . OLANZapine (ZYPREXA) injection 5 mg  5 mg Intramuscular TID PRN Jomarie LongsSaramma Demetries Coia, MD      . risperiDONE (RISPERDAL) tablet 3 mg  3 mg Oral QHS Kerry HoughSpencer E Simon, PA-C   3 mg at 06/15/15 2149  . traZODone (DESYREL) tablet 50 mg  50 mg Oral QHS,MR X 1 Oneta Rackanika N Lewis, NP   50 mg at 06/15/15 2149    Lab Results:  Results for orders placed or performed during the hospital encounter of 06/13/15 (from the past 48 hour(s))  TSH     Status: None   Collection Time: 06/16/15  6:30 AM  Result Value Ref Range   TSH 1.028 0.350 - 4.500 uIU/mL    Comment[facility]    Blood Alcohol level:  Lab Results  Component Value Date   [facility] 06/13/2015    Physical Findings: AIMS: Facial and Oral Movements Muscles of  Facial Expression: None, normal Lips and Perioral Area: None, normal Jaw: None, normal Tongue: None, normal,Extremity Movements Upper (arms, wrists, hands, fingers): None, normal Lower (legs, knees, ankles, toes): None, normal, Trunk Movements Neck, shoulders, hips: None, normal, Overall Severity Severity of abnormal movements (highest score from questions above): None, normal Incapacitation due to abnormal movements: None, normal Patient's awareness of abnormal movements (rate only patient's report): No Awareness, Dental Status Current problems with teeth and/or dentures?: No Does patient usually wear dentures?: No  CIWA:  CIWA-Ar Total: 1 COWS:  COWS Total Score: 3  Musculoskeletal: Strength & Muscle Tone: within normal limits Gait & Station: normal Patient leans: N/A  Psychiatric Specialty Exam: Review of Systems  Psychiatric/Behavioral: Positive for hallucinations. The patient is nervous/anxious.   All other systems reviewed and are negative.   Blood pressure 116/67, pulse 115, temperature 98.4 F (36.9 C), temperature source Oral, resp. rate 18, height 5\' 6"  (1.676 m), weight 81.874 kg (180 lb 8 oz), SpO2 95 %.Body mass index is 29.15 kg/(m^2).  General Appearance: Disheveled  Eye Contact::  Poor  Speech:  Normal Rate  Volume:  Decreased  Mood:  Anxious and Depressed  Affect:  Depressed  Thought Process:  Linear  Orientation:  Full (Time, Place, and Person)  Thought Content:  Hallucinations: Auditory and Rumination  Suicidal Thoughts:  No  Homicidal Thoughts:  No  Memory:  Immediate;   Fair Recent;   Fair Remote;   Fair  Judgement:  Impaired  Insight:  Fair  Psychomotor Activity:  Normal  Concentration:  Fair  Recall:  [employer]  Fund of Knowledge:Fair  Language: Fair  Akathisia:  No  Handed:  Right  AIMS (if indicated):     Assets:  Communication Skills Physical Health  ADL's:  Intact  Cognition: WNL  Sleep:  Number of Hours: 6.75   Treatment Plan  Summary:Shawn Koch, 35 years old, caucasian male , who is single, homeless, unemployed , who has a hx of depression unpsecified , was admitted for depression and  suicidal ideations.Pt with some improvement of his sx, will continue treatment.  Daily contact with patient to assess and evaluate symptoms and progress in treatment and Medication management Estimated length of stay is 5-7 days.  Reviewed past medical records,treatment plan.   Will continue Risperidone 3 mg po qhs for psychosis/mood sx. Will continue Cogentin 1 mg po qhs for EPS. Will continue Depakote ER 750 mg po qhs for mood sx. Depakote level in 5 days.  Will continue Trazodone 50 mg po qhs for sleep.  Started CIWA protocol for ?BZD abuse - UDS pos. Will continue to monitor vitals ,medication compliance and treatment side effects while patient is here.  Will monitor for medical issues as well as call consult as needed.  Reviewed labs TSH - wnl, hba1c, PL pending , EKG - qtc wnl ,will order lipid panel. CSW will continue to  work on disposition.  Patient to participate in therapeutic milieu .    Jasmina Gendron, MD 06/16/2015, 11:31 AM

## 2015-06-16 NOTE — [facility] Group Notes (Signed)
[facility]  06/16/2015 8:45 AM  Participation Quality: Alert, Appropriate and Oriented  Mood/Affect: Appropriate  Depression Rating: 0  Anxiety Rating: 4-5  Thoughts of Suicide: Pt denies SI/HI  Will you contract for safety? Yes  Current AVH: Pt denies  Plan for Discharge/Comments: Pt attended discharge planning group and actively participated in group. CSW discussed suicide prevention education with the group and encouraged them to discuss discharge planning and any relevant barriers. Pt reports that he slept well and that his mood is improving.  Transportation Means: Pt reports access to transportation  Supports: No supports mentioned at this time  Chad CordialLauren Carter, LCSWA 06/16/2015 1:35 PM

## 2015-06-16 NOTE — Progress Notes (Signed)
D: Patient alert and oriented x 4. Patient denies pain/SI/HI/AVH. Patient complained of being asthma symptoms and asked for inhaler. Doctor on call notified and verbal order for albuterol received 2 puffs Q6hrs PRN. PRN dose given at 2149 with effective results. Patient continues to keep to self and not much interactions with other patients on the unit.   A: Staff to monitor Q 15 mins for safety. Encouragement and support offered. Scheduled medications administered per orders. R: Patient remains safe on the unit. Patient attended group tonight. Patient visible on the unit. Patient taking administered medications.

## 2015-06-16 NOTE — [facility] Group Notes (Signed)
[facility] LCSW Group Therapy  06/16/2015 1:15pm  Type of Therapy:  Group Therapy vercoming Obstacles  Participation Level:  Minimal  Participation Quality:  Reserved  Affect:  Flat  Cognitive:  Appropriate and Oriented  Insight:  Developing/Improving and Improving  Engagement in Therapy:  Improving  Modes of Intervention:  Discussion, Exploration, Problem-solving and Support  Description of Group:   In this group patients will be encouraged to explore what they see as obstacles to their own wellness and recovery. They will be guided to discuss their thoughts, feelings, and behaviors related to these obstacles. The group will process together ways to cope with barriers, with attention given to specific choices patients can make. Each patient will be challenged to identify changes they are motivated to make in order to overcome their obstacles. This group will be process-oriented, with patients participating in exploration of their own experiences as well as giving and receiving support and challenge from other group members.  Summary of Patient Progress: Pt reports that homelessness and broken family relationships are an obstacle at this time. Pt identifies increased anxiety related to this obstacle and is still trying to problem-solve in order to deal with this obstacle.   Therapeutic Modalities:   Cognitive Behavioral Therapy Solution Focused Therapy Motivational Interviewing Relapse Prevention Therapy   Chad CordialLauren Carter, LCSWA 06/16/2015 3:47 PM

## 2015-06-17 LAB — LIPID PANEL
CHOLESTEROL: 233 mg/dL — AB (ref 0–200)
HDL: 39 mg/dL — ABNORMAL LOW (ref >40)
LDL Cholesterol: 156 mg/dL — ABNORMAL HIGH (ref 0–99)
TRIGLYCERIDES: 189 mg/dL — AB (ref <150)
Total CHOL/HDL Ratio: 6 RATIO
VLDL: 38 mg/dL (ref 0–40)

## 2015-06-17 LAB — HEMOGLOBIN A1C
HEMOGLOBIN A1C: 5.4 % (ref 4.8–5.6)
MEAN PLASMA GLUCOSE: 108 mg/dL

## 2015-06-17 LAB — PROLACTIN: PROLACTIN: 27.2 ng/mL — AB (ref 4.0–15.2)

## 2015-06-17 MED ORDER — ATORVASTATIN CALCIUM 20 MG PO TABS
20.0000 mg | ORAL_TABLET | Freq: Every day | ORAL | Status: DC
  Administered 2015-06-17 – 2015-06-19 (×3): 20 mg via ORAL
  Filled 2015-06-17 (×2): qty 1
  Filled 2015-06-17: qty 7
  Filled 2015-06-17: qty 2
  Filled 2015-06-17 (×2): qty 1

## 2015-06-17 NOTE — Progress Notes (Signed)
D. Patient presents with depressed mood, affect blunted. Jill AlexandersJustin has been cooperative , appropriate on the unit. He has been attending unit programming, and denies any acute concerns with writer this am. He completed his self inventory and denies any acute concern,s rates his depression and hopelessness at 0/10 on scale, 10 being worst. He appears in no acute distress. A. Medications given as ordered, support and encouragement provided. Pt attended group with staff encouragement. R. Patient is safe, denies any acute concerns at this time. Will con't to monitor  q 15 minutes for safety.

## 2015-06-17 NOTE — Progress Notes (Signed)
Adult Psychoeducational Group Note  Date:  06/17/2015 Time:  9:40 PM  Group Topic/Focus:  Wrap-Up Group:   The focus of this group is to help patients review their daily goal of treatment and discuss progress on daily workbooks.  Participation Level:  Active  Participation Quality:  Appropriate  Affect:  Appropriate  Cognitive:  Appropriate  Insight: Appropriate  Engagement in Group:  Engaged  Modes of Intervention:  Discussion  Additional Comments: The patient expressed that he attended groups and had a good day.  Octavio Mannshigpen, Tevis Conger Lee 06/17/2015, 9:40 PM

## 2015-06-17 NOTE — [facility] Group Notes (Signed)
[facility] Group Notes:  (Nursing/MHT/Case Management/Adjunct)  Date:  06/17/2015  Time:  9am  Type of Therapy:  Psychoeducational Skills--healthy coping skills   Participation Level:  Minimal  Participation Quality:  Drowsy  Affect:  Depressed  Cognitive:  Appropriate  Insight:  Limited  Engagement in Group:  Engaged  Modes of Intervention:  Discussion, Education and Exploration  Summary of Progress/Problems: discussed healthy coping skills and ways to utilize to maintain stress.  Malva LimesStrader, Ollin Hochmuth 06/17/2015, 11:03 AM

## 2015-06-17 NOTE — [facility] Group Notes (Signed)
Adult Psychoeducational Group Note  Date:  06/17/2015 Time:  12:25 AM  Group Topic/Focus:  Wrap-Up Group:   The focus of this group is to help patients review their daily goal of treatment and discuss progress on daily workbooks.  Participation Level:  Did Not Attend  Participation Quality:  None  Affect:  None  Cognitive:  None  Insight: None  Engagement in Group:  None  Modes of Intervention:  Discussion  Additional Comments:  Pt did not attend group.  Caroll RancherLindsay, Harim Bi A 06/17/2015, 12:25 AM

## 2015-06-17 NOTE — [facility] Group Notes (Signed)
[facility] Group Notes: (Clinical Social Work)  06/17/2015 11:15-12:00PM  Summary of Progress/Problems: Today's process group involved patients discussing their feelings related to being hospitalized, as well as how they can avoid future hospitalizations. An exploration of key elements of wellness included medications, adherence, use of doctors, use of therapy, sleep hygiene, and a number of healthy coping skills that various patients have tried successfully. The patient expressed his primary feeling about being hospitalized is "good because I needed it."  He stated that he did not always admit he needs help, but is willing to admit that now.  He did not talk for the remainder of group, but was attentive.  Type of Therapy: Group Therapy - Process  Participation Level: Active  Participation Quality: Attentive  Affect: Appropriate  Cognitive: Appropriate  Insight: Improving  Engagement in Therapy: Improving  Modes of Intervention: Exploration, Discussion  Ambrose MantleMareida Grossman-Orr, LCSW 06/17/2015, 12:14 PM

## 2015-06-17 NOTE — Plan of Care (Signed)
Problem: Diagnosis: Increased Risk For Suicide Attempt Goal: STG-Patient Will Comply With Medication Regime Outcome: Progressing Pt compliant with medication regime     

## 2015-06-17 NOTE — Progress Notes (Addendum)
Patient ID: Shawn Koch, male   DOB: 28-Sep-1980, 35 y.o.   MRN: [id number] [facility] MD Progress Note  06/17/2015 3:21 PM Shawn Koch  MRN:  [id number] Subjective: Patient states ' Koch am a bit better.'  Objective:Shawn Koch, 35 years old, caucasian male , who is single, homeless, unemployed , who has a hx of depression unpsecified , was admitted for depression and  suicidal ideations.  Patient seen and chart reviewed. Discussed patient with treatment team. Pt today seen as withdrawn, minimal eye contact. He spends more time in his room than anywhere else. Group session participation is minimal. He says his symptoms are improving on the current medictaions. He denies any SIHI, AVH, delusional thoughts or paranoia. Pt per staff reports, Shawn Koch is compliant with taking his medications, denies ADRS. He is being encouraged to attend groups and participate in milieu.  Principal Problem: Schizoaffective disorder, bipolar type (HCC)  Diagnosis:   Patient Active Problem List   Diagnosis Date Noted  . Schizoaffective disorder, bipolar type (HCC) [F25.0] 06/15/2015   Total Time spent with patient: 30 minutes  Past Psychiatric History: As per H&p  Past Medical History:  Past Medical History  Diagnosis Date  . Asthma   . Depression    History reviewed. No pertinent past surgical history. Family History:  Family History  Problem Relation Age of Onset  . Schizophrenia Brother    Family Psychiatric  History: As per H&p  Social History: single, homeless, unemployed. History  Alcohol Use No     History  Drug Use  . Yes  . Special: Marijuana    Social History   Social History  . Marital Status: Single    Spouse Name: N/A  . Number of Children: N/A  . Years of Education: N/A   Social History Main Topics  . Smoking status: Current Some Day Smoker    Types: Cigars  . Smokeless tobacco: None  . Alcohol Use: No  . Drug Use: Yes    Special: Marijuana  . Sexual Activity: Not Asked    Other Topics Concern  . None   Social History Narrative   Additional Social History:    History of alcohol / drug use?:  (".) Longest period of sobriety (when/how long): these past 8 years Name of Substance 1: Cocaine  Sleep: Good  Appetite:  Fair  Current Medications: Current Facility-Administered Medications  Medication Dose Route Frequency Provider Last Rate Last Dose  . acetaminophen (TYLENOL) tablet 650 mg  650 mg Oral Q6H PRN Charm Rings, NP      . albuterol (PROVENTIL HFA;VENTOLIN HFA) 108 (90 Base) MCG/ACT inhaler 2 puff  2 puff Inhalation Q6H PRN Craige Cotta, MD   2 puff at 06/15/15 2149  . alum & mag hydroxide-simeth (MAALOX/MYLANTA) 200-200-20 MG/5ML suspension 30 mL  30 mL Oral Q4H PRN Charm Rings, NP      . benztropine (COGENTIN) tablet 1 mg  1 mg Oral QHS Jomarie Longs, MD   1 mg at 06/16/15 2111  . chlordiazePOXIDE (LIBRIUM) capsule 25 mg  25 mg Oral Q6H PRN Jomarie Longs, MD      . divalproex (DEPAKOTE ER) 24 hr tablet 750 mg  750 mg Oral Daily Adonis Brook, NP   750 mg at 06/17/15 0809  . magnesium hydroxide (MILK OF MAGNESIA) suspension 30 mL  30 mL Oral Daily PRN Charm Rings, NP      . OLANZapine zydis (ZYPREXA) disintegrating tablet 10 mg  10 mg Oral TID  PRN Jomarie Longs, MD       Or  . OLANZapine (ZYPREXA) injection 5 mg  5 mg Intramuscular TID PRN Jomarie Longs, MD      . risperiDONE (RISPERDAL) tablet 3 mg  3 mg Oral QHS Kerry Hough, PA-C   3 mg at 06/16/15 2111  . traZODone (DESYREL) tablet 50 mg  50 mg Oral QHS,MR X 1 Oneta Rack, NP   50 mg at 06/16/15 2111   Lab Results:  Results for orders placed or performed during the hospital encounter of 06/13/15 (from the past 48 hour(s))  TSH     Status: None   Collection Time: 06/16/15  6:30 AM  Result Value Ref Range   TSH 1.028 0.350 - 4.500 uIU/mL    Comment: Performed at [facility]  Prolactin     Status: Abnormal   Collection Time: 06/16/15  6:30 AM   Result Value Ref Range   Prolactin 27.2 (H) 4.0 - 15.2 ng/mL    Comment: (NOTE) Performed At: [facility] [address] [place], [place] [id number] Mila Homer MD [id number] Performed at [facility]   Hemoglobin A1c     Status: None   Collection Time: 06/16/15  6:30 AM  Result Value Ref Range   Hgb A1c MFr Bld 5.4 4.8 - 5.6 %    Comment: (NOTE)         Pre-diabetes: 5.7 - 6.4         Diabetes: >6.4         Glycemic control for adults with diabetes: <7.0    Mean Plasma Glucose 108 mg/dL    Comment: (NOTE) Performed At: [facility] [address] [place], [place] [id number] Mila Homer MD [id number] Performed at [facility]   Lipid panel     Status: Abnormal   Collection Time: 06/17/15  6:30 AM  Result Value Ref Range   Cholesterol 233 (H) 0 - 200 mg/dL   Triglycerides 413 (H) <150 mg/dL   HDL 39 (L) >24 mg/dL   Total CHOL/HDL Ratio 6.0 RATIO   VLDL 38 0 - 40 mg/dL   LDL Cholesterol 401 (H) 0 - 99 mg/dL    Comment:        Total Cholesterol/HDL:CHD Risk Coronary Heart Disease Risk Table                     Men   Women  1/2 Average Risk   3.4   3.3  Average Risk       5.0   4.4  2 X Average Risk   9.6   7.1  3 X Average Risk  23.4   11.0        Use the calculated Patient Ratio above and the CHD Risk Table to determine the patient's CHD Risk.        ATP III CLASSIFICATION (LDL):  <100     mg/dL   Optimal  027-253  mg/dL   Near or Above                    Optimal  130-159  mg/dL   Borderline  664-403  mg/dL   High  >474     mg/dL   Very High Performed at [facility]     Blood Alcohol level:  Lab Results  Component Value Date   [facility] [place] <5 06/13/2015   Physical Findings: AIMS: Facial and Oral Movements Muscles  of Facial Expression: None, normal Lips and Perioral Area: None, normal Jaw: None, normal Tongue: None, normal,Extremity Movements Upper (arms, wrists, hands, fingers):  None, normal Lower (legs, knees, ankles, toes): None, normal, Trunk Movements Neck, shoulders, hips: None, normal, Overall Severity Severity of abnormal movements (highest score from questions above): None, normal Incapacitation due to abnormal movements: None, normal Patient's awareness of abnormal movements (rate only patient's report): No Awareness, Dental Status Current problems with teeth and/or dentures?: No Does patient usually wear dentures?: No  CIWA:  CIWA-Ar Total: 0 COWS:  COWS Total Score: 3  Musculoskeletal: Strength & Muscle Tone: within normal limits Gait & Station: normal Patient leans: N/A  Psychiatric Specialty Exam: Review of Systems  Psychiatric/Behavioral: Positive for hallucinations. The patient is nervous/anxious.   All other systems reviewed and are negative.   Blood pressure 105/78, pulse 100, temperature 97.8 F (36.6 C), temperature source Oral, resp. rate 16, height 5\' 6"  (1.676 m), weight 81.874 kg (180 lb 8 oz), SpO2 95 %.Body mass index is 29.15 kg/(m^2).  General Appearance: Disheveled  Eye Contact::  Poor  Speech:  Normal Rate  Volume:  Decreased  Mood:  Anxious and Depressed  Affect:  Depressed  Thought Process:  Linear  Orientation:  Full (Time, Place, and Person)  Thought Content:  Hallucinations: Auditory and Rumination  Suicidal Thoughts:  No  Homicidal Thoughts:  No  Memory:  Immediate;   Fair Recent;   Fair Remote;   Fair  Judgement:  Impaired  Insight:  Fair  Psychomotor Activity:  Normal  Concentration:  Fair  Recall:  [employer]is[employer]Fair  Fund of Knowledge:Fair  Language: Fair  Akathisia:  No  Handed:  Right  AIMS (if indicated):     Assets:  Communication Skills Physical Health  ADL's:  Intact  Cognition: WNL  Sleep:  Number of Hours: 6.75   Treatment Plan Summary: Shawn AlbertJustin Koch, 35 years old, caucasian male, who is single, homeless, unemployed, who has a hx of depression unpsecified, was admitted for depression and  suicidal  ideations. Pt with some improvement of his sx, will continue treatment.  Daily contact with patient to assess and evaluate symptoms and progress in treatment and Medication management: Estimated length of stay is 5-7 days.   Will continue Risperidone 3 mg po qhs for psychosis/mood sx. Will continue Cogentin 1 mg po qhs for EPS. Will continue Depakote ER 750 mg po qhs for mood sx. Depakote level in a.m 06-19-15. Will continue Trazodone 50 mg po qhs for sleep. Will continue to monitor vitals ,medication compliance and treatment side effects while patient is here.  Will monitor for medical issues as well as call consult as needed.  Reviewed labs TSH - wnl, hba1c, PL 27.2, EKG - qtc wnl, reviewed lipid panel; Trigleceride - 189, Chol. 233.HDL 39, LDL 156, will initiate Lipitor 40 mg. CSW will continue to  work on disposition.  Patient to participate in therapeutic milieu .   Shawn Koch, Shawn I, NP, PMHNP-BC 06/17/2015, 3:21 PM Agree with NP Progress Note, as above  Nehemiah MassedFernando Cobos, MD

## 2015-06-17 NOTE — Progress Notes (Signed)
Patient ID: Shawn Koch, male   DOB: Sep 05, 1980, 35 y.o.   MRN: [id number] D: Patient alert and cooperative. Pt in room most of the evening not interacting with peers. Pt mood and affect appeared depressed and anxious. Pt reports he attended most of the day group but not the evening wrap up group. Pt denies SI/HI/AVH and pain. No acute physical distress noted.  A: Met with pt 1:1. Medications administered as prescribed. Support and encouragement provided to attend groups and engage in milieu. Pt encouraged to discuss feelings and come to staff with any question or concerns.  R: Patient remains safe and complaint with medications.

## 2015-06-18 NOTE — Progress Notes (Signed)
DAR NOTE: Pt present with flat affect and depressed mood in the unit. Pt has been isolating himself and has been bed most of the time. Pt denies physical pain, took all his meds as scheduled. Patient did not fill out the self inventory forms. Pt stated that he slept good last night and has no concerns. Pt ensured with 15 minute and environmental checks. Pt currently denies SI/HI and A/V hallucinations. Pt verbally agrees to seek staff if SI/HI or A/VH occurs and to consult with staff before acting on these thoughts. Will continue POC.

## 2015-06-18 NOTE — Progress Notes (Signed)
Adult Psychoeducational Group Note  Date:  06/18/2015 Time:  9:09 PM  Group Topic/Focus:  Wrap-Up Group:   The focus of this group is to help patients review their daily goal of treatment and discuss progress on daily workbooks.  Participation Level:  Active  Participation Quality:  Appropriate  Affect:  Appropriate  Cognitive:  Appropriate  Insight: Appropriate  Engagement in Group:  Engaged  Modes of Intervention:  Discussion  Additional Comments: The patient expressed that he attend groups.The patient also said that music therapy and his day was good.  Octavio Mannshigpen, Bennie Chirico Lee 06/18/2015, 9:09 PM

## 2015-06-18 NOTE — Progress Notes (Signed)
Patient ID: Shawn Koch, male   DOB: 06-Mar-1981, 35 y.o.   MRN: [id number] D: Patient denies SI/HI/AVH and pain. Pt mood and affect appeared depressed and flat. Pt reports feeling sad this evening because he has not received a visitor since admission. Pt called his grandparents to let them know about his whereabout. Pt attended and participated in evening wrap up group. Cooperative with assessment.   A: Medications administered as prescribed. Support and encouragement offered as needed.  R: Patient remains safe and complaint with medications.

## 2015-06-18 NOTE — [facility] Group Notes (Signed)
[facility] Group Notes:  (Clinical Social Work)  06/18/2015  11:00AM-12:00PM  Summary of Progress/Problems:  The main focus of today's process group was to listen to a variety of genres of music and to identify that different types of music provoke different responses.  The patient then was able to identify personally what was soothing for them, as well as energizing.  Today in group, there were many patients singing, a few dancing, and many visibly enjoyed themselves, were encouraging to others to participate. The patient expressed understanding of concepts, as well as knowledge of how each type of music affected him and how this can be used at home as a wellness/recovery tool.  He did not talk during group, but visibly enjoyed the music, smiling frequently.  At the beginning of group he stated he was quite tired (7 out of 10), but at the end of group he said he was not at all tired.  Type of Therapy:  Music Therapy   Participation Level:  Active  Participation Quality:  Attentive   Affect:  Blunted  Cognitive:  Oriented  Insight:  Engaged  Engagement in Therapy:  Engaged  Modes of Intervention:   Activity, Exploration  Ambrose MantleMareida Grossman-Orr, LCSW 06/18/2015 2:43 PM

## 2015-06-18 NOTE — Progress Notes (Signed)
D: Patient seen resting in his room at the shift change. Cheerful upon approach. Patient stated "I'm cool. Just want to rest in here". Denied pain, SI, AH/VH at this time. No new complaint. Spent little time on day room. A:  Support and encouragement offered to patient as needed. Due medications given as ordered. No behavioral issues noted. Will continue to monitor patient for safety and stability. R:  Patient remains appropriate and safe.

## 2015-06-18 NOTE — Progress Notes (Signed)
Patient ID: Shawn Koch, male   DOB: [birth year], 35 y.o.   MRN: [id number] Patient ID: Shawn Koch, male   DOB: 08-27-1980, 35 y.o.   MRN: [id number] [facility] MD Progress Note  06/18/2015 3:59 PM BISHOP VANDERWERF  MRN:  [id number]  Subjective: Patient states "I'm doing all right"  Objective: Braxdon Gappa, 35 years old, Caucasian male , who is single, homeless, unemployed , who has a hx of depression unpsecified , was admitted for depression and  suicidal ideations.  Patient seen and chart reviewed. Discussed patient with treatment team. [name] is seen in his room. He is lying down in bed with minimal eye contact. He spends more time in his room than anywhere else. Group session participation is minimal. He says his symptoms are improving on the current medictaions. He denies any SIHI, AVH, delusional thoughts or paranoia. Per staff reports, [name] is compliant with taking his medications, denies ADRS. He is being encouraged to attend groups and participate in milieu. He says he does not eat break fast. And that is why he spends more time in his bed prior to lunch time. He says eating 3 times a makes him feel sluggish.   Principal Problem: Schizoaffective disorder, bipolar type (HCC) Diagnosis:   Patient Active Problem List   Diagnosis Date Noted  . Schizoaffective disorder, bipolar type (HCC) [F25.0] 06/15/2015   Total Time spent with patient: 30 minutes  Past Psychiatric History: as per H&p  Past Medical History:  Past Medical History  Diagnosis Date  . Asthma   . Depression    History reviewed. No pertinent past surgical history. Family History:  Family History  Problem Relation Age of Onset  . Schizophrenia Brother    Family Psychiatric  History: as per H&p Social History: single, homeless, unemployed. History  Alcohol Use No     History  Drug Use  . Yes  . Special: Marijuana    Social History   Social History  . Marital Status: Single    Spouse Name: N/A  .  Number of Children: N/A  . Years of Education: N/A   Social History Main Topics  . Smoking status: Current Some Day Smoker    Types: Cigars  . Smokeless tobacco: None  . Alcohol Use: No  . Drug Use: Yes    Special: Marijuana  . Sexual Activity: Not Asked   Other Topics Concern  . None   Social History Narrative   Additional Social History:    History of alcohol / drug use?:  (".) Longest period of sobriety (when/how long): these past 8 years Name of Substance 1: Cocaine  Sleep: Fair  Appetite:  Fair  Current Medications: Current Facility-Administered Medications  Medication Dose Route Frequency Provider Last Rate Last Dose  . acetaminophen (TYLENOL) tablet 650 mg  650 mg Oral Q6H PRN Charm Rings, NP      . albuterol (PROVENTIL HFA;VENTOLIN HFA) 108 (90 Base) MCG/ACT inhaler 2 puff  2 puff Inhalation Q6H PRN Craige Cotta, MD   2 puff at 06/15/15 2149  . alum & mag hydroxide-simeth (MAALOX/MYLANTA) 200-200-20 MG/5ML suspension 30 mL  30 mL Oral Q4H PRN Charm Rings, NP      . atorvastatin (LIPITOR) tablet 20 mg  20 mg Oral q1800 Sanjuana Kava, NP   20 mg at 06/17/15 1713  . benztropine (COGENTIN) tablet 1 mg  1 mg Oral QHS Jomarie Longs, MD   1 mg at 06/17/15 2114  . chlordiazePOXIDE (LIBRIUM) capsule  25 mg  25 mg Oral Q6H PRN Jomarie LongsSaramma Eappen, MD      . divalproex (DEPAKOTE ER) 24 hr tablet 750 mg  750 mg Oral Daily Adonis BrookSheila Agustin, NP   750 mg at 06/18/15 0825  . magnesium hydroxide (MILK OF MAGNESIA) suspension 30 mL  30 mL Oral Daily PRN Charm RingsJamison Y Lord, NP      . OLANZapine zydis (ZYPREXA) disintegrating tablet 10 mg  10 mg Oral TID PRN Jomarie LongsSaramma Eappen, MD       Or  . OLANZapine (ZYPREXA) injection 5 mg  5 mg Intramuscular TID PRN Jomarie LongsSaramma Eappen, MD      . risperiDONE (RISPERDAL) tablet 3 mg  3 mg Oral QHS Kerry HoughSpencer E Simon, PA-C   3 mg at 06/17/15 2114  . traZODone (DESYREL) tablet 50 mg  50 mg Oral QHS,MR X 1 Oneta Rackanika N Lewis, NP   50 mg at 06/17/15 2115    Lab  Results:  Results for orders placed or performed during the hospital encounter of 06/13/15 (from the past 48 hour(s))  Lipid panel     Status: Abnormal   Collection Time: 06/17/15  6:30 AM  Result Value Ref Range   Cholesterol 233 (H) 0 - 200 mg/dL   Triglycerides 191189 (H) <150 mg/dL   HDL 39 (L) >47>40 mg/dL   Total CHOL/HDL Ratio 6.0 RATIO   VLDL 38 0 - 40 mg/dL   LDL Cholesterol 829156 (H) 0 - 99 mg/dL    Comment:        Total Cholesterol/HDL:CHD Risk Coronary Heart Disease Risk Table                     Men   Women  1/2 Average Risk   3.4   3.3  Average Risk       5.0   4.4  2 X Average Risk   9.6   7.1  3 X Average Risk  23.4   11.0        Use the calculated Patient Ratio above and the CHD Risk Table to determine the patient's CHD Risk.        ATP III CLASSIFICATION (LDL):  <100     mg/dL   Optimal  562-130100-129  mg/dL   Near or Above                    Optimal  130-159  mg/dL   Borderline  [id number]  mg/dL   High  >696>190     mg/dL   Very High [facility]     Blood Alcohol level:  Lab Results  Component Value Date   [facility]   Physical Findings: AIMS: Facial and Oral Movements Muscles of Facial Expression: None, normal Lips and Perioral Area: None, normal Jaw: None, normal Tongue: None, normal,Extremity Movements Upper (arms, wrists, hands, fingers): None, normal Lower (legs, knees, ankles, toes): None, normal, Trunk Movements Neck, shoulders, hips: None, normal, Overall Severity Severity of abnormal movements (highest score from questions above): None, normal Incapacitation due to abnormal movements: None, normal Patient's awareness of abnormal movements (rate only patient's report): No Awareness, Dental Status Current problems with teeth and/or dentures?: No Does patient usually wear dentures?: No  CIWA:  CIWA-Ar Total: 0 COWS:  COWS Total Score: 3  Musculoskeletal: Strength & Muscle Tone: within normal limits Gait & Station:  normal Patient leans: N/A  Psychiatric Specialty Exam: Review of Systems  Psychiatric/Behavioral: Positive for hallucinations. The patient is  nervous/anxious.   All other systems reviewed and are negative.   Blood pressure 105/78, pulse 100, temperature 97.8 F (36.6 C), temperature source Oral, resp. rate 16, height  (1.676 m), weight 81.874 kg (180 lb 8 oz), SpO2 95 %.Body mass index is 29.15 kg/(m^2).  General Appearance: Disheveled  Eye Contact::  Poor  Speech:  Normal Rate  Volume:  Decreased  Mood:  Anxious and Depressed  Affect:  Depressed  Thought Process:  Linear  Orientation:  Full (Time, Place, and Person)  Thought Content:  Hallucinations: Auditory and Rumination  Suicidal Thoughts:  No  Homicidal Thoughts:  No  Memory:  Immediate;   Fair Recent;   Fair Remote;   Fair  Judgement:  Impaired  Insight:  Fair  Psychomotor Activity:  Normal  Concentration:  Fair  Recall:  [employer] of Knowledge:Fair  Language: Fair  Akathisia:  No  Handed:  Right  AIMS (if indicated):     Assets:  Communication Skills Physical Health  ADL's:  Intact  Cognition: WNL  Sleep:  Number of Hours: 6.75   Treatment Plan Summary: Gene Glazebrook, 35 years old, caucasian male, who is single, homeless, unemployed, who has a hx of depression unpsecified, was admitted for depression and  suicidal ideations. Pt with some improvement of his sx, will continue treatment.  Daily contact with patient to assess and evaluate symptoms and progress in treatment and Medication management: Estimated length of stay is 5-7 days.   Will continue Risperidone 3 mg po qhs for psychosis/mood sx. Will continue Cogentin 1 mg po qhs for EPS. Will continue Depakote ER 750 mg po qhs for mood sx. Depakote level in a.m 06-19-15, will review lab value when it becomes available. Will continue Trazodone 50 mg po qhs for sleep. Will continue to monitor vitals, medication compliance and treatment side effects while  patient is here.  Will monitor for medical issues as well as call consult as needed.  Reviewed labs TSH - wnl, hba1c, Prolactin level reviewed 27.2 , EKG - qtc wnl, reviewed lipid panel; Trigleceride - 189, Chol. 233.HDL 39, LDL 156, will initiate Lipitor 40 mg. CSW will continue to  work on disposition.  Patient to participate in therapeutic milieu .   Sanjuana Kava, NP, PMHNP-BC 06/18/2015, 3:59 PM Agree with NP Progress Note, as above  Nehemiah Massed, MD

## 2015-06-19 LAB — VALPROIC ACID LEVEL: Valproic Acid Lvl: 40 ug/mL — ABNORMAL LOW (ref 50.0–100.0)

## 2015-06-19 MED ORDER — DIVALPROEX SODIUM ER 500 MG PO TB24: 1000.0000 mg | ORAL_TABLET | Freq: Every day | ORAL | Status: DC

## 2015-06-19 MED ORDER — TRAZODONE HCL 100 MG PO TABS
100.0000 mg | ORAL_TABLET | Freq: Every day | ORAL | Status: DC
  Administered 2015-06-19: 100 mg via ORAL
  Filled 2015-06-19: qty 7
  Filled 2015-06-19 (×2): qty 1

## 2015-06-19 MED ORDER — DIVALPROEX SODIUM ER 500 MG PO TB24
1000.0000 mg | ORAL_TABLET | Freq: Every day | ORAL | Status: DC
  Filled 2015-06-19: qty 14
  Filled 2015-06-19: qty 2

## 2015-06-19 NOTE — Progress Notes (Signed)
DAR Note: Shawn Koch has been up and visible on the unit.  Attending groups.  Minimal interaction with peers but he has been in the day room more today.  He denies any physical complaints and appears to be in no physical distress.  Appetite good and reports that he is sleeping pretty good.  He denies SI/HI or A/V hallucinations.  He completed his self inventory and reports that his depression and hopelessness are 0/10 and his anxiety is 4/10.  His goal for today is "to develop a goal for release" and he will accomplish this goal by "attend group."  Encouraged continued participation in group and unit activities.  Q 15 minute checks maintained for safety.  We will continue to monitor the progress towards his goals.

## 2015-06-19 NOTE — [facility] Group Notes (Signed)
[facility] LCSW Group Therapy  06/19/2015 1:15 pm  Type of Therapy: Process Group Therapy  Participation Level:  Active  Participation Quality:  Appropriate  Affect:  Flat  Cognitive:  Oriented  Insight:  Improving  Engagement in Group:  Limited  Engagement in Therapy:  Limited  Modes of Intervention:  Activity, Clarification, Education, Problem-solving and Support  Summary of Progress/Problems: Today's group addressed the issue of overcoming obstacles.  Patients were asked to identify their biggest obstacle post d/c that stands in the way of their on-going success, and then problem solve as to how to manage this. Stayed the entire time.  Engaged throughout. Talked about his decision making process to ask for help this time. "I generally toe the line between depression and suicidal thoughts, and I knew I need help when the thoughts increased.  But this has been a very helpful experience.  I don't think I need to push it so far in the future.  I will ask for help sooner."  Shawn Koch, Shawn Koch B 06/19/2015   4:39 PM

## 2015-06-19 NOTE — Progress Notes (Signed)
Patient ID: Shawn Koch, male   DOB: June 11, 1980, 35 y.o.   MRN: [id number] Patient ID: Shawn Koch, male   DOB: Mar 08, 1981, 35 y.o.   MRN: [id number] Shawn Koch  06/19/2015 1:15 PM Shawn Koch  MRN:  [id number]  Subjective: Patient states "I'm doing fine."  Objective: Shawn Koch, 35 years old, Caucasian male , who is single, homeless, unemployed , who has a hx of depression unpsecified , was admitted for depression and  suicidal ideations.  Patient seen and chart reviewed. Discussed patient with treatment team.  Pt seen as calm , less anxious , less depressed than on admission. Pt does report sleep issues and requiring an extra dose of trazodone last night. Pt encouraged to attend groups and participate in milieu.   Principal Problem: Schizoaffective disorder, bipolar type (HCC) Diagnosis:   Patient Active Problem List   Diagnosis Date Noted  . Schizoaffective disorder, bipolar type (HCC) [F25.0] 06/15/2015   Total Time spent with patient: 25 minutes  Past Psychiatric History: as per H&p  Past Medical History:  Past Medical History  Diagnosis Date  . Asthma   . Depression    History reviewed. No pertinent past surgical history. Family History:  Family History  Problem Relation Age of Onset  . Schizophrenia Brother    Family Psychiatric  History: as per H&p Social History: single, homeless, unemployed. History  Alcohol Use No     History  Drug Use  . Yes  . Special: Marijuana    Social History   Social History  . Marital Status: Single    Spouse Name: N/A  . Number of Children: N/A  . Years of Education: N/A   Social History Main Topics  . Smoking status: Current Some Day Smoker    Types: Cigars  . Smokeless tobacco: None  . Alcohol Use: No  . Drug Use: Yes    Special: Marijuana  . Sexual Activity: Not Asked   Other Topics Concern  . None   Social History Narrative   Additional Social History:    History of alcohol / drug  use?:  (".) Longest period of sobriety (when/how long): these past 8 years Name of Substance 1: Cocaine  Sleep: Poor  Appetite:  Fair  Current Medications: Current Facility-Administered Medications  Medication Dose Route Frequency Provider Last Rate Last Dose  . acetaminophen (TYLENOL) tablet 650 mg  650 mg Oral Q6H PRN Charm Rings, NP      . albuterol (PROVENTIL HFA;VENTOLIN HFA) 108 (90 Base) MCG/ACT inhaler 2 puff  2 puff Inhalation Q6H PRN Craige Cotta, MD   2 puff at 06/15/15 2149  . alum & mag hydroxide-simeth (MAALOX/MYLANTA) 200-200-20 MG/5ML suspension 30 mL  30 mL Oral Q4H PRN Charm Rings, NP      . atorvastatin (LIPITOR) tablet 20 mg  20 mg Oral q1800 Sanjuana Kava, NP   20 mg at 06/18/15 1829  . benztropine (COGENTIN) tablet 1 mg  1 mg Oral QHS Jomarie Longs, MD   1 mg at 06/18/15 2106  . chlordiazePOXIDE (LIBRIUM) capsule 25 mg  25 mg Oral Q6H PRN Jomarie Longs, MD      . Melene Muller ON 06/20/2015] divalproex (DEPAKOTE ER) 24 hr tablet 1,000 mg  1,000 mg Oral QHS Cohl Behrens, MD      . magnesium hydroxide (MILK OF MAGNESIA) suspension 30 mL  30 mL Oral Daily PRN Charm Rings, NP      . OLANZapine zydis (ZYPREXA) disintegrating  tablet 10 mg  10 mg Oral TID PRN Jomarie Longs, MD       Or  . OLANZapine (ZYPREXA) injection 5 mg  5 mg Intramuscular TID PRN Jomarie Longs, MD      . risperiDONE (RISPERDAL) tablet 3 mg  3 mg Oral QHS Kerry Hough, PA-C   3 mg at 06/18/15 2106  . traZODone (DESYREL) tablet 100 mg  100 mg Oral QHS Jomarie Longs, MD        Lab Results:  Results for orders placed or performed during the hospital encounter of 06/13/15 (from the past 48 hour(s))  Valproic acid level     Status: Abnormal   Collection Time: 06/19/15  6:35 AM  Result Value Ref Range   Valproic Acid Lvl 40 (L) 50.0 - 100.0 ug/mL    Comment: Performed at [facility]    Blood Alcohol level:  Lab Results  Component Value Date   [facility] <5 06/13/2015    Physical Findings: AIMS: Facial and Oral Movements Muscles of Facial Expression: None, normal Lips and Perioral Area: None, normal Jaw: None, normal Tongue: None, normal,Extremity Movements Upper (arms, wrists, hands, fingers): None, normal Lower (legs, knees, ankles, toes): None, normal, Trunk Movements Neck, shoulders, hips: None, normal, Overall Severity Severity of abnormal movements (highest score from questions above): None, normal Incapacitation due to abnormal movements: None, normal Patient's awareness of abnormal movements (rate only patient's report): No Awareness, Dental Status Current problems with teeth and/or dentures?: No Does patient usually wear dentures?: No  CIWA:  CIWA-Ar Total: 0 COWS:  COWS Total Score: 3  Musculoskeletal: Strength & Muscle Tone: within normal limits Gait & Station: normal Patient leans: N/A  Psychiatric Specialty Exam: Review of Systems  Psychiatric/Behavioral: Positive for hallucinations. The patient is nervous/anxious and has insomnia.   All other systems reviewed and are negative.   Blood pressure 100/81, pulse 92, temperature 97.5 F (36.4 C), temperature source Oral, resp. rate 20, height  (1.676 m), weight 81.874 kg (180 lb 8 oz), SpO2 95 %.Body mass index is 29.15 kg/(m^2).  General Appearance: Casual  Eye Contact::  Fair  Speech:  Normal Rate  Volume:  Decreased  Mood:  Anxious and Depressed  Affect:  Depressed  Thought Process:  Linear  Orientation:  Full (Time, Place, and Person)  Thought Content:  Rumination  Suicidal Thoughts:  No  Homicidal Thoughts:  No  Memory:  Immediate;   Fair Recent;   Fair Remote;   Fair  Judgement:  Impaired  Insight:  Fair  Psychomotor Activity:  Normal  Concentration:  Fair  Recall:  [employer] of Knowledge:Fair  Language: Fair  Akathisia:  No  Handed:  Right  AIMS (if indicated):     Assets:  Communication Skills Physical Health  ADL's:  Intact  Cognition: WNL  Sleep:   Number of Hours: 7   Treatment Plan Summary: Shawn Koch, 35 years old, caucasian male, who is single, homeless, unemployed, who has a hx of depression unpsecified, was admitted for depression and  suicidal ideations. Pt with some improvement of his sx, will continue treatment.  Daily contact with patient to assess and evaluate symptoms and progress in treatment and Medication management: Estimated length of stay is 5-7 days.   Will continue Risperidone 3 mg po qhs for psychosis/mood sx. Will continue Cogentin 1 mg po qhs for EPS. Will increase Depakote ER to 1000 mg po qhs for mood sx. Depakote level in a.m 06-19-15- 40 ( subtherapeutic )  Will get another level in 3-5 days. Will increase Trazodone to 100 mg po qhs for sleep. Will continue to monitor vitals, medication compliance and treatment side effects while patient is here.  Will monitor for medical issues as well as call consult as needed.  Reviewed labs TSH - wnl, hba1c, Prolactin level reviewed 27.2 , EKG - qtc wnl, reviewed lipid panel; Trigleceride - 189, Chol. 233.HDL 39, LDL 156, continue Lipitor 40 mg. CSW will continue to  work on disposition.  Patient to participate in therapeutic milieu .   Shawn Raether, MD,06/19/2015, 1:15 PM

## 2015-06-19 NOTE — Plan of Care (Signed)
Problem: Alteration in mood & ability to function due to Goal: STG-Patient will attend groups Outcome: Progressing Pt attended and engaged in evening wrap up group     

## 2015-06-19 NOTE — Progress Notes (Signed)
Patient ID: Shawn LappingJustin L Amores, male   DOB: 23-Jun-1980, 35 y.o.   MRN: [id number] D: Patient denies SI/HI/AVH and pain. Pt reports feeling excited about possible discharge tomorrow. Pt stated he will be staying with his grandfather in [place]. Pt in dayroom most of the evening interacting with peers. Cooperative with assessment.   A: Medications administered as prescribed. Support and encouragement offered as needed.  R: Patient remains safe and complaint with medications.

## 2015-06-20 MED ORDER — ATORVASTATIN CALCIUM 20 MG PO TABS: 20.0000 mg | ORAL_TABLET | Freq: Every day | ORAL | Status: AC

## 2015-06-20 MED ORDER — TRAZODONE HCL 100 MG PO TABS: 100.0000 mg | ORAL_TABLET | Freq: Every day | ORAL | Status: AC

## 2015-06-20 MED ORDER — RISPERIDONE 3 MG PO TABS: 3.0000 mg | ORAL_TABLET | Freq: Every day | ORAL | Status: AC

## 2015-06-20 MED ORDER — BENZTROPINE MESYLATE 1 MG PO TABS: 1.0000 mg | ORAL_TABLET | Freq: Every day | ORAL | Status: AC

## 2015-06-20 MED ORDER — DIVALPROEX SODIUM ER 500 MG PO TB24: 1000.0000 mg | ORAL_TABLET | Freq: Every day | ORAL | Status: AC
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
## 2015-06-20 NOTE — Discharge Summary (Signed)
Physician Discharge Summary Note  Patient:  Shawn LappingJustin L Koch is an 35 y.o., male MRN:  [id number] DOB:  1980/08/01 Patient phone:  [phone] (home)  Patient address:   [address] [place] [phone],  Total Time spent with patient: 30 minutes  Date of Admission:  06/13/2015 Date of Discharge: 06/20/2015  Reason for Admission:PER H&P- Shawn AlbertJustin Koch, 35 years old was admitted for depression and passive suicidal ideations. He stated, "I have reached my end point, been depressed a long time, I thought about harming myself. He stated that his trigger was being homeless and kicked out of where he was living with his mom. Shawn Koch was able to express himself well. He stated that he worked for [employer] but could not articulate how his life turned negative. He has very little support from family. Despite having parents with substance abuse problems, he stated that he grew up in a stable home and family life. He has a brother with schizo, bipolar mood d/o, ADHD.  He was diagnosed with depression when he was younger but reports he has always been able to "fool the doctors" that he was depressed. He last saw a mental health provider 10 years ago and was prescribed Celexa and Seroquel. Both meds were not ineffective and made him sleepy and he is not wanting to be placed back on it. He denied having active suicidal thoughts and denied wanting to end his life. He stated he had tried once by taking 300 sleeping pills to overdose.  Principal Problem: Schizoaffective disorder, bipolar type Shawn Koch(HCC) Discharge Diagnoses: Patient Active Problem List   Diagnosis Date Noted  . Schizoaffective disorder, bipolar type (HCC) [F25.0] 06/15/2015    Past Psychiatric History:see above Past Medical History:  Past Medical History  Diagnosis Date  . Asthma   . Depression    History reviewed. No pertinent past surgical history. Family History:  Family History  Problem Relation Age of Onset   . Schizophrenia Brother   // Family Psychiatric  History: See Above Social History:  History  Alcohol Use No     History  Drug Use  . Yes  . Special: Marijuana    Social History   Social History  . Marital Status: Single    Spouse Name: N/A  . Number of Children: N/A  . Years of Education: N/A   Social History Main Topics  . Smoking status: Current Some Day Smoker    Types: Cigars  . Smokeless tobacco: None  . Alcohol Use: No  . Drug Use: Yes    Special: Marijuana  . Sexual Activity: Not Asked   Other Topics Concern  . None   Social History Narrative    Hospital Course: Shawn Koch was admitted for Schizoaffective disorder, bipolar type [facility](HCC) and crisis management.  Pt was treated discharged with the medications listed below under Medication List.  Medical problems were identified and treated as needed.  Home medications were restarted as appropriate.  Improvement was monitored by observation and Shawn Koch 's daily report of symptom reduction.  Emotional and mental status was monitored by daily self-inventory reports completed by Shawn Koch and clinical staff.         Shawn Koch was evaluated by the treatment team for stability and plans for continued recovery upon discharge. Shawn Koch 's motivation was an integral factor for scheduling further treatment. Employment, transportation, bed availability, health status, family support, and any pending legal issues were also considered during hospital stay.  Pt was offered further treatment options upon discharge including but not limited to Residential, Intensive Outpatient, and Outpatient treatment.  Shawn Koch will follow up with the services as listed below under Follow Up Information.     Upon completion of this admission the patient was both mentally and medically stable for discharge denying suicidal/homicidal ideation, auditory/visual/tactile hallucinations, delusional thoughts and  paranoia.    Shawn Koch responded well to treatment with Risperdal, Trazodone, Depakote and cogentin without adverse effects.Pt demonstrated improvement without reported or observed adverse effects to the point of stability appropriate for outpatient management. Pertinent labs include: Lipid panel, CMP, Depakote 40(low), Prolactin 27.2(high),for which outpatient follow-up is necessary for lab recheck as mentioned below. Reviewed CBC, CMP, BAL, and UDS+ for benzodiazepines on admission; all unremarkable aside from noted exceptions.   Physical Findings: AIMS: Facial and Oral Movements Muscles of Facial Expression: None, normal Lips and Perioral Area: None, normal Jaw: None, normal Tongue: None, normal,Extremity Movements Upper (arms, wrists, hands, fingers): None, normal Lower (legs, knees, ankles, toes): None, normal, Trunk Movements Neck, shoulders, hips: None, normal, Overall Severity Severity of abnormal movements (highest score from questions above): None, normal Incapacitation due to abnormal movements: None, normal Patient's awareness of abnormal movements (rate only patient's report): No Awareness, Dental Status Current problems with teeth and/or dentures?: No Does patient usually wear dentures?: No  CIWA:  CIWA-Ar Total: 0 COWS:  COWS Total Score: 3  Musculoskeletal: Strength & Muscle Tone: within normal limits Gait & Station: normal Patient leans: N/A  Psychiatric Specialty Exam: SEE SRA BY MD Review of Systems  Psychiatric/Behavioral: Negative for hallucinations and substance abuse. Suicidal ideas: stable. Nervous/anxious: stable.        Per assessment provided by MD and APP  All other systems reviewed and are negative.   Blood pressure 94/53, pulse 102, temperature 98.5 F (36.9 C), temperature source Oral, resp. rate 16, height  (1.676 m), weight 81.874 kg (180 lb 8 oz), SpO2 95 %.Body mass index is 29.15 kg/(m^2).  Have you used any form of tobacco in the last  30 days? (Cigarettes, Smokeless Tobacco, Cigars, and/or Pipes): Yes  Has this patient used any form of tobacco in the last 30 days? (Cigarettes, Smokeless Tobacco, Cigars, and/or Pipes), No  Blood Alcohol level:  Lab Results  Component Value Date   ETH <5 06/13/2015    Metabolic Disorder Labs:  Lab Results  Component Value Date   HGBA1C 5.4 06/16/2015   MPG 108 06/16/2015   Lab Results  Component Value Date   PROLACTIN 27.2* 06/16/2015   Lab Results  Component Value Date   CHOL 233* 06/17/2015   TRIG 189* 06/17/2015   HDL 39* 06/17/2015   CHOLHDL 6.0 06/17/2015   VLDL 38 06/17/2015   LDLCALC 156* 06/17/2015    See Psychiatric Specialty Exam and Suicide Risk Assessment completed by Attending Physician prior to discharge.  Discharge destination:  Home Is patient on multiple antipsychotic therapies at discharge:  No   Has Patient had three or more failed trials of antipsychotic monotherapy by history:  No  Recommended Plan for Multiple Antipsychotic Therapies: NA     Medication List    STOP taking these medications        albuterol 108 (90 Base) MCG/ACT inhaler  Commonly known as:  PROVENTIL HFA;VENTOLIN HFA     diphenhydrAMINE 25 MG tablet  Commonly known as:  BENADRYL      TAKE these medications      Indication   atorvastatin  20 MG tablet  Commonly known as:  LIPITOR  Take 1 tablet (20 mg total) by mouth daily at 6 PM.   Indication:  Inherited Heterozygous Hypercholesterolemia, High Amount of Triglycerides in the Blood     benztropine 1 MG tablet  Commonly known as:  COGENTIN  Take 1 tablet (1 mg total) by mouth at bedtime.   Indication:  Extrapyramidal Reaction caused by Medications     divalproex 500 MG 24 hr tablet  Commonly known as:  DEPAKOTE ER  Take 2 tablets (1,000 mg total) by mouth at bedtime.   Indication:  mood stabilization     risperiDONE 3 MG tablet  Commonly known as:  RISPERDAL  Take 1 tablet (3 mg total) by mouth at bedtime.    Indication:  mood stabilization     traZODone 100 MG tablet  Commonly known as:  DESYREL  Take 1 tablet (100 mg total) by mouth at bedtime.   Indication:  Trouble Sleeping           Follow-up Information    Follow up with [facility] On 06/22/2015.   Why:  Thursday at 8:30 for your hospital follow up appointment   Contact information:   [address]  [place] [phone]      Follow-up recommendations:  Activity:  as tolerated Diet:  heart healthy Tests:  depatkote level 3/9/ Other:  follow-up with aftercare  Comments: Take all medications as prescribed. Keep all follow-up appointments as scheduled.  Do not consume alcohol or use illegal drugs while on prescription medications. Report any adverse effects from your medications to your primary care provider promptly.  In the event of recurrent symptoms or worsening symptoms, call 911, a crisis hotline, or go to the nearest emergency department for evaluation.  Signed: Oneta Rack, NP 06/20/2015, 2:01 PM

## 2015-06-20 NOTE — Progress Notes (Signed)
Shawn Koch has been up and visible on the unit.  He is attending groups and interacting with peers.  He denies SI/HI or A/V hallucinations.  He reports that he is ready to leave and live with his grandparents.  He states that they will be picking him up today.  He denies any pain or discomfort and appears to be in no physical distress.  He completed his self inventory and reports that his depression and hopelessness are 0/10 and anxiety is 4/10 and his goal for today is "being released" and he will accomplish this goal by "whatever it takes to be released."  Pt. D/C from the unit accompanied by family.  He was pleasant and cooperative.  D/C follow up paperwork reviewed with pt and copy sent as well as prescriptions and samples.  Belongings (from locker 53-Green duffle bag in back room, black carry bag, black laptop charger, brown hat, dell laptop, birth certificate, 4 birthday cards, white charger adapter, plaid PJ's, dentures in yellow case, white lighter, black lighter, 1 black and mild, granola bars and 1 bottle soda) returned to pt. Q 15 min checks maintained until discharge.

## 2015-06-20 NOTE — Progress Notes (Signed)
  [facility] Discharge Plan :  Will you be returning to the same living situation after discharge:  Yes,  home At discharge, do you have transportation home?: Yes,  family Do you have the ability to pay for your medications: Yes,  mental health  Release of information consent forms completed and in the chart;  Patient's signature needed at discharge.  Patient to Follow up at: Follow-up Information    [facility]   Specialty:  Behavioral Health   Why:  Go to the walk-in clinic this week between 8 and 11AM for your hospital follow up apointment. Please get a Depakote level in 3 to 5 days.  Take a Prolactin level in 3 months.   Contact information:   [address] [place] [address] [phone]       Next level of care provider has access to [facility] Link:no  Safety Planning and Suicide Prevention discussed: Yes,  yes  Have you used any form of tobacco in the last 30 days? (Cigarettes, Smokeless Tobacco, Cigars, and/or Pipes): Yes  Has patient been referred to the Quitline?: Patient refused referral  Patient has been referred for addiction treatment: N/A  Ida Rogueorth, Sherah Lund B 06/20/2015, 10:32 AM

## 2015-06-20 NOTE — [facility] Suicide Risk Assessment (Signed)
[facility]   Principal Problem: Schizoaffective disorder, bipolar type [facility](HCC) Discharge Diagnoses:  Patient Active Problem List   Diagnosis Date Noted  . Schizoaffective disorder, bipolar type (HCC) [F25.0] 06/15/2015    Total Time spent with patient: 30 minutes  Musculoskeletal: Strength & Muscle Tone: within normal limits Gait & Station: normal Patient leans: N/A  Psychiatric Specialty Exam: Review of Systems  Psychiatric/Behavioral: Negative for depression and hallucinations. The patient is not nervous/anxious.   All other systems reviewed and are negative.   Blood pressure 94/53, pulse 102, temperature 98.5 F (36.9 C), temperature source Oral, resp. rate 16, height 5\' 6"  (1.676 m), weight 81.874 kg (180 lb 8 oz), SpO2 95 %.Body mass index is 29.15 kg/(m^2).  General Appearance: Casual  Eye Contact::  Fair  Speech:  Clear and Coherent409  Volume:  Normal  Mood:  Euthymic  Affect:  Appropriate  Thought Process:  Coherent  Orientation:  Full (Time, Place, and Person)  Thought Content:  WDL  Suicidal Thoughts:  No  Homicidal Thoughts:  No  Memory:  Immediate;   Fair Recent;   Fair Remote;   Fair  Judgement:  Fair  Insight:  Fair  Psychomotor Activity:  Normal  Concentration:  Fair  Recall:  FiservFair  Fund of Knowledge:Fair  Language: Fair  Akathisia:  No  Handed:  Right  AIMS (if indicated):     Assets:  Desire for Improvement  Sleep:  Number of Hours: 5.5  Cognition: WNL  ADL's:  Intact   Mental Status Per Nursing Assessment::   On Admission:  Suicidal ideation indicated by patient, Self-harm thoughts, Self-harm behaviors  Demographic Factors:  Male and Caucasian  Loss Factors: Financial problems/change in socioeconomic status  Historical Factors: Impulsivity  Risk Reduction Factors:   Positive therapeutic relationship  Continued Clinical Symptoms:  Previous Psychiatric Diagnoses and Treatments  Cognitive Features That  Contribute To Risk:  None    Suicide Risk:  Minimal: No identifiable suicidal ideation.  Patients presenting with no risk factors but with morbid ruminations; may be classified as minimal risk based on the severity of the depressive symptoms    Plan Of Care/Follow-up recommendations:  Activity:  no restrictions Diet:  regular Tests:  depakote level on 06/22/15 Other:  follow up with aftercare  Kimberlie Csaszar, MD 06/20/2015, 9:28 AM

## 2015-06-20 NOTE — [facility] Group Notes (Signed)
[facility] Group Notes:  (Nursing/MHT/Case Management/Adjunct)  Date:  06/20/2015  Time:  9:30am  Type of Therapy:  Nurse Education  Participation Level:  Active  Participation Quality:  Appropriate, Attentive, Sharing and Supportive  Affect:  Appropriate  Cognitive:  Appropriate and Oriented  Insight:  Appropriate  Engagement in Group:  Engaged and Supportive  Modes of Intervention:  Discussion and Education  Summary of Progress/Problems:  Group topic today was recovery.  Discussed what recovery means to the group.  Discussed long term and short term goals as well as sleep hygiene.  Shawn Koch reports that he likes to play the trumpet in his spare time and he is planning on living with his grandparents when he is discharged.    Norm ParcelHeather V Council Munguia 06/20/2015, 11:11 AM

## 2015-06-20 NOTE — Plan of Care (Signed)
Problem: Diagnosis: Increased Risk For Suicide Attempt Goal: STG-Patient Will Comply With Medication Regime Outcome: Progressing Pt compliant with medication     

## 2016-07-08 IMAGING — DX DG CHEST 2V
2 series · 2 of 2 positions shown · non-contrast
Comparison: None.

CLINICAL DATA: Cough and fever for 2 days, smoker

EXAM:
CHEST  2 VIEW

[chest pa]
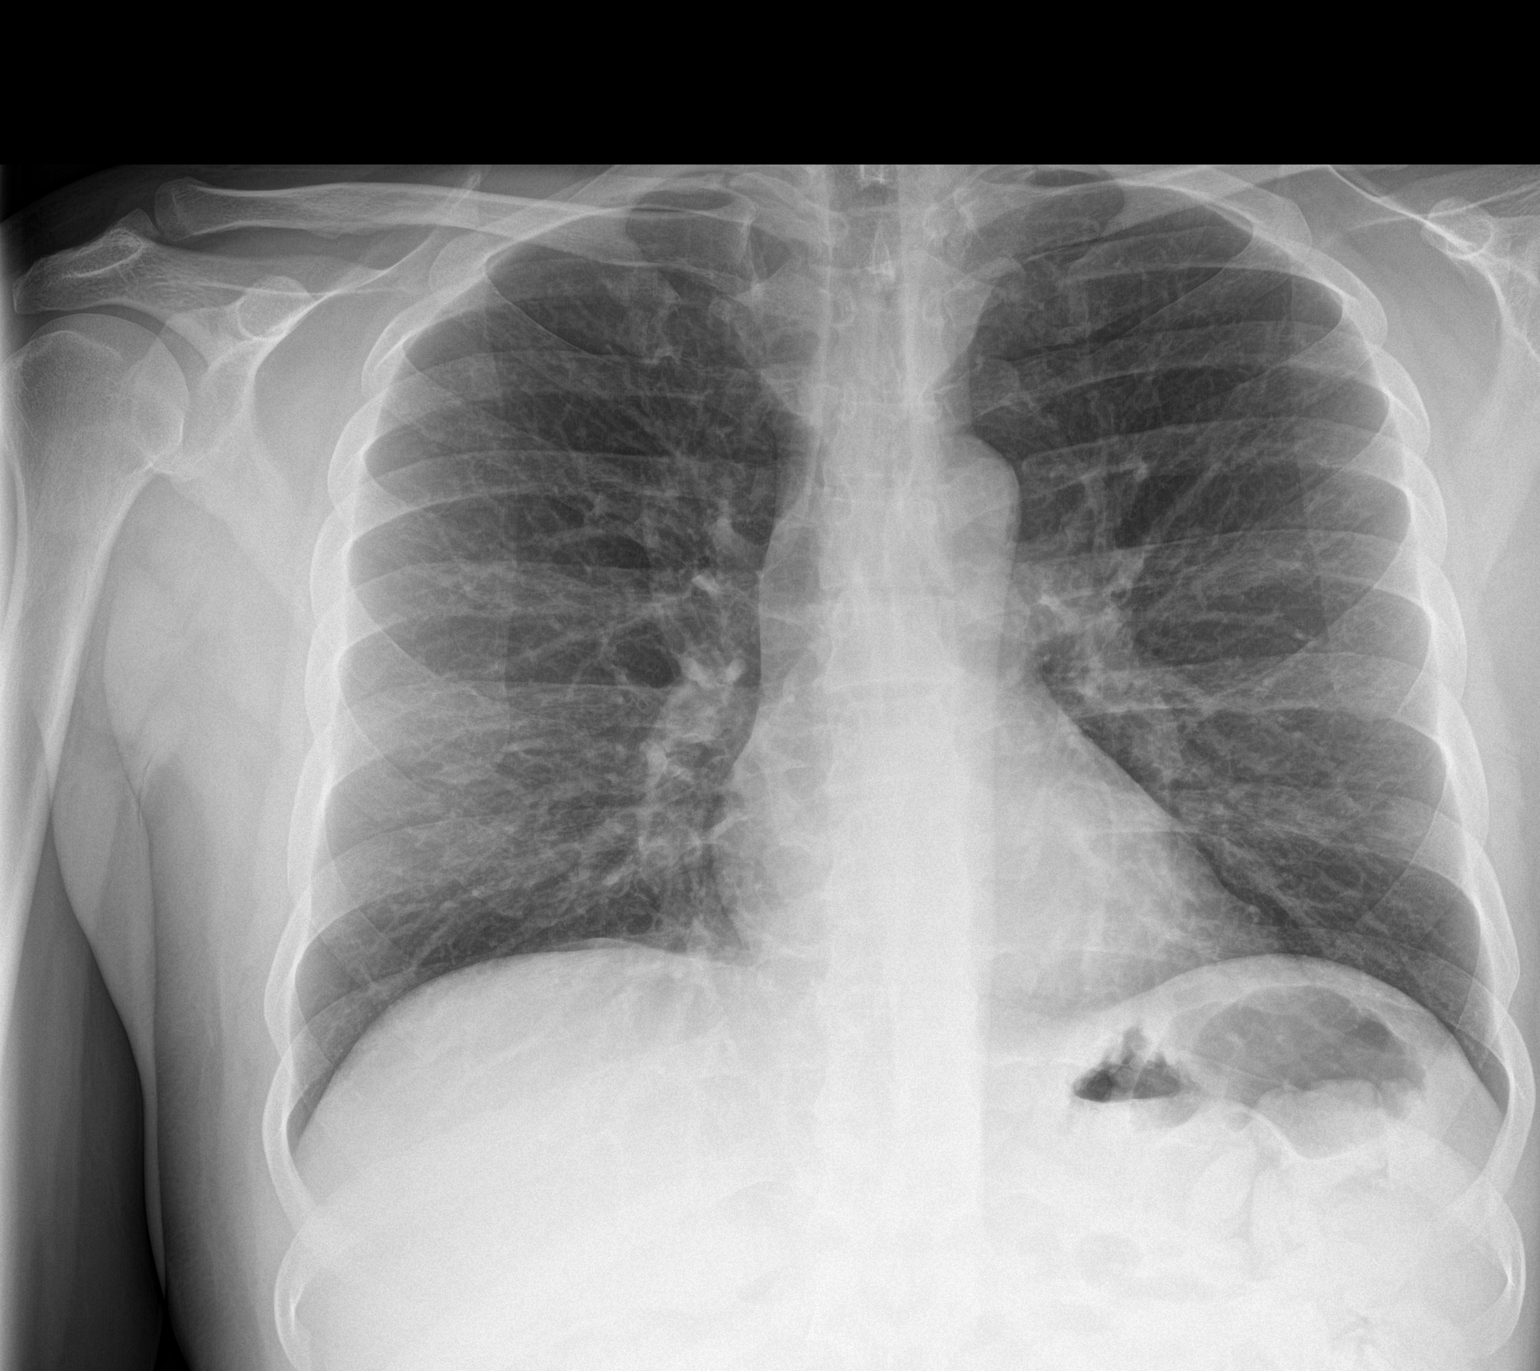

[chest lat]
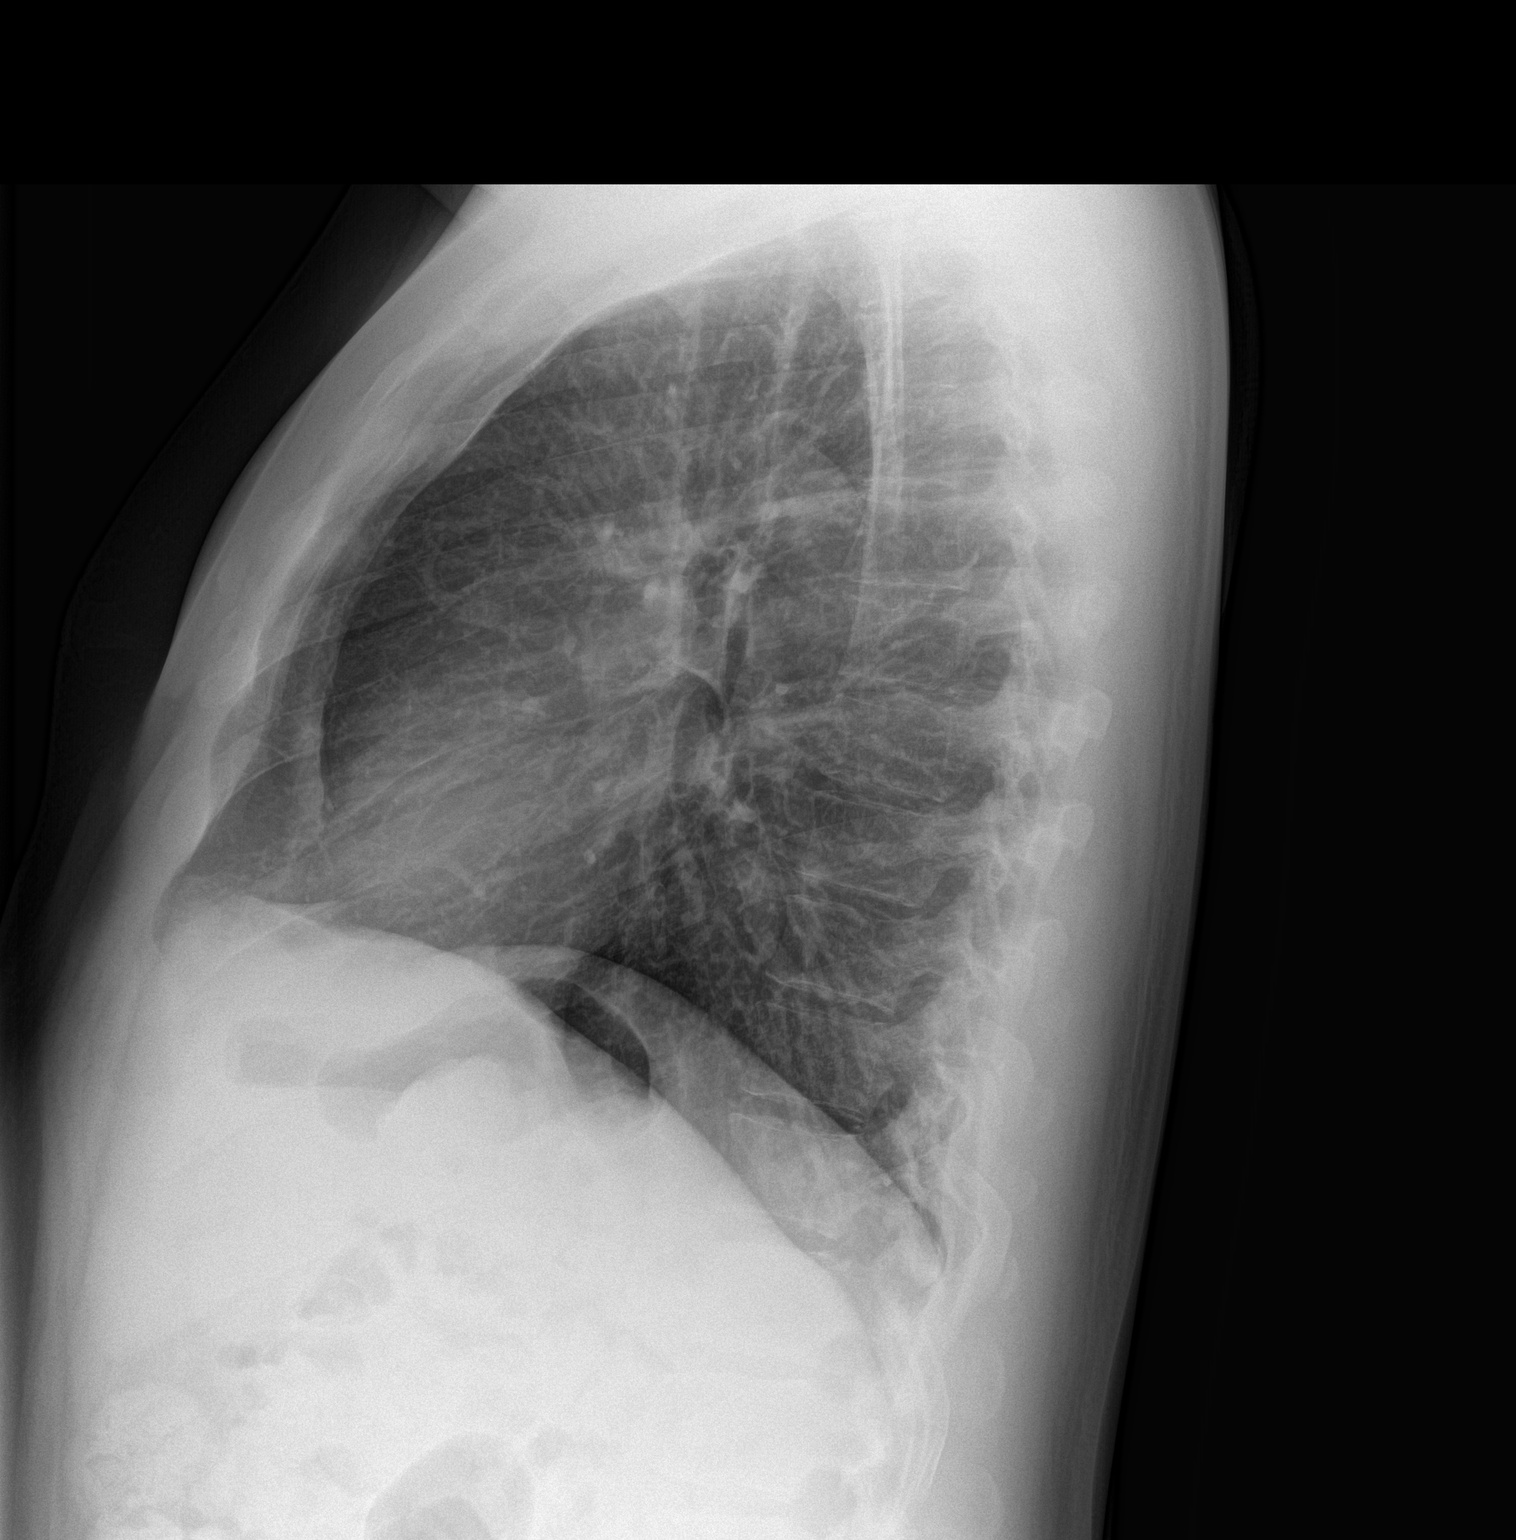

[2 of 2 positions shown; findings below may reference images not displayed]

FINDINGS: Cardiomediastinal silhouette is unremarkable. No acute infiltrate or
pleural effusion. No pulmonary edema. Mild perihilar increased
bronchial markings without focal consolidation. Bony thorax is
unremarkable.
IMPRESSION: No acute infiltrate or pulmonary edema. Mild perihilar increased
bronchial markings without focal consolidation.

## 2020-08-04 ENCOUNTER — Other Ambulatory Visit: Payer: Self-pay

## 2020-08-04 ENCOUNTER — Ambulatory Visit (HOSPITAL_COMMUNITY)
Admission: EM | Admit: 2020-08-04 | Discharge: 2020-08-04 | Disposition: A | Discharge status: Home / Self Care | Payer: No Payment, Other | Attending: Psychiatry | Admitting: Psychiatry

## 2020-08-04 DIAGNOSIS — F250 Schizoaffective disorder, bipolar type: Secondary | ICD-10-CM | POA: Insufficient documentation

## 2020-08-04 DIAGNOSIS — Z5900 Homelessness unspecified: Secondary | ICD-10-CM | POA: Insufficient documentation

## 2020-08-04 MED ORDER — ARIPIPRAZOLE 10 MG PO TABS
10.0000 mg | ORAL_TABLET | Freq: Every day | ORAL | Status: DC
  Filled 2020-08-04: qty 7

## 2020-08-04 MED ORDER — ARIPIPRAZOLE 10 MG PO TABS: 10.0000 mg | ORAL_TABLET | Freq: Every day | ORAL | 0 refills | Status: AC

## 2020-08-04 NOTE — BH Assessment (Signed)
Triage Note- URGENT: Pt presents voluntarily to back [place] via [employer]. Pt reports 911 referred him to [facility]. He states he primarily needs assistance with housing- he is afraid of being harmed living on the street. Pt reports he tried to get to [facility] in [place] today for an appointment following d/c from [facility] admission. Also states he is out of Abilify.

## 2020-08-04 NOTE — Discharge Instructions (Signed)
Please come to [facility] (this facility) during walk in hours for appointment with psychiatrist for further medication management and for therapists for therapy.   Walk in hours are 8-11 AM Monday through Thursday for medication management.It is first come, first -serve; it is best to arrive by 7:00 AM. Child and adolescent psychiatrists are only available on Wednesdays and Thursdays during walk in hours. On Friday from 1 pm to 4 pm for therapy intake only. Please arrive by 12:00 pm as it is  first come, first -serve.   When you arrive please go upstairs for your appointment. If you are unsure of where to go, inform the front desk that you are here for a walk in appointment and they will assist you with directions upstairs.  Address:  [address] in [place], [address] Ph: [phone]

## 2020-08-04 NOTE — Progress Notes (Signed)
Shawn Koch received his AVS, medication and two bus passes per his request. He retrieved his personal belongings and was dscharged without incident.

## 2020-08-04 NOTE — ED Provider Notes (Signed)
[facility] Medical Screening Exam  Patient Name: Shawn Koch MRN: [id number] Date of Evaluation: 08/04/20 Chief Complaint:   Diagnosis:  Final diagnoses:  Schizoaffective disorder, bipolar type (HCC)  Homelessness    History of Present illness: Shawn Koch is a 40 y.o. male with a history of schizoaffective disorder, bipolar type who presents to the [facility] via [facility] for passive SI in the setting of feeling like he "can't find help" with housing assistance. Pt states that he was recently admitted to [facility] for overdose and was discharged 2 weeks ago. He states that he called 911 today because it "was hard to find a place to stay". He states that he works for [employer] and had a go fund me page set up and he raised enough money to spend some nights in a hotel but now has been unable to find a place to stay. He states that his recent admission to [facility] was for a SA where he ingested >100 25 mg benadryl and 30 risperdal pills. He states that he was admitted to the medical floor for 2 days before being transferred to the psychiatric unit and spent a total of ~10 days in the hospital. He states that he was discharged with abilify 7 day sample (believes it was 10 mg), depakote 500 mg BID, and cogentin (unk dose); he states that he ran out of the abilify and requests a refill although has enough of his other medications. He states that he had an aftercare appointment scheduled at [facility] in [place] but was unable to get there and attempted to go to [facility] in [place] but was told they don't have mental health providers. Pt states that he is homeless in [place] and now does not have a psychiatric provider; discussed open access hours at the [facility], pt expresses interest and states that he plans on coming during open acces hours for a walk in appointment. Pt reports that he had some passive SI earlier when thinking about how he "can't find help" related to housing although denies plan  or intent. He denies HI/AVH. Pt states that what he would like help most with is "finding help for anywhere to go". Pt states he had called several shelters who reported that they were closed. TTS counselor is present who informs patient that most shelters are not open during the day and open up in the late afternoon/early evening so that while they may not be open currently, later in the day they will be open. Pt expresses relief and states that he feels "Stupid" for coming in seeking assistance for housing because he did not know that. Pt expresses that he feels safe for discharge upon learning this information. Pt is provided with list of shelter resources by TTS provider. Pt is also provided with 7 day samples of abilify as well as a bus pass.  Obtained from chart review and from patient Past Psychiatric History: Previous Medication Trials: depakote, cogentin, abilify, prozac, risperdal, celexa, seroquel, trazodone Previous Psychiatric Hospitalizations: yes x3, 2 SA and one for SI [facility] in 2017) Previous Suicide Attempts: yes - x2; both by overdose. One recently as per HPI and one ~3 years ago where he overdosed on depakote History of Violence: no Outpatient psychiatrist: no; unable to get to aftercare appt at [facility] [place]  Social History: Marital Status: not married Children: 0 Source of Income: Scientist, research (medical) Education: [employer] graduate Special Ed: did not assess Housing Status: homeless History of phys/sexual abuse: no Easy access to  gun: no  Substance Use (with emphasis over the last 12 months) Recreational Drugs: denies Use of Alcohol: denied   Legal History: Past Charges/Incarcerations: states that his license is suspended and had a charge for failure to appear in court but "didn't know about it" Pending charges: denies  Family Psychiatric History: Brother-schizophrenia   Psychiatric Specialty Exam  Presentation  General Appearance:Appropriate for Environment; Casual  Eye  Contact:Fair  Speech:Clear and Coherent; Normal Rate  Speech Volume:Normal  Handedness:No data recorded  Mood and Affect  Mood:Euthymic  Affect:Appropriate; Congruent   Thought Process  Thought Processes:Coherent; Goal Directed; Linear  Descriptions of Associations:Intact  Orientation:Full (Time, Place and Person)  Thought Content:WDL    Hallucinations:None  Ideas of Reference:None  Suicidal Thoughts:Yes, Passive (reported that he had passive SI when he thought he had no where to stay.)  Homicidal Thoughts:No   Sensorium  Memory:Immediate Good; Recent Good; Remote Good  Judgment:Good  Insight:Good   Executive Functions  Concentration:Good  Attention Span:Good  Recall:Good  Fund of Knowledge:Good  Language:Good   Psychomotor Activity  Psychomotor Activity:Normal   Assets  Assets:Communication Skills; Desire for Improvement; Resilience; Vocational/Educational   Sleep  Sleep:Fair  Number of hours: No data recorded  No data recorded  Physical Exam: Physical Exam Constitutional:      Appearance: Normal appearance. He is normal weight.  HENT:     Head: Normocephalic and atraumatic.  Eyes:     Extraocular Movements: Extraocular movements intact.  Pulmonary:     Effort: Pulmonary effort is normal.  Neurological:     Mental Status: He is alert.    Review of Systems  Constitutional: Negative for chills and fever.  Eyes: Negative for discharge and redness.  Respiratory: Negative for cough.   Cardiovascular: Negative for chest pain.  Musculoskeletal: Negative for myalgias.  Neurological: Negative for headaches.  Psychiatric/Behavioral: Negative for depression, substance abuse and suicidal ideas.   Blood pressure 131/77, pulse 100, temperature 98.6 F (37 C), temperature source Oral, resp. rate 16, SpO2 99 %. There is no height or weight on file to calculate BMI.  Musculoskeletal: Strength & Muscle Tone: within normal limits Gait &  Station: normal Patient leans: N/A   [facility] MSE Discharge Disposition for Follow up and Recommendations: Based on my evaluation the patient does not appear to have an emergency medical condition and can be discharged with resources and follow up care in outpatient services for Medication Management and Individual Therapy  -provided with 7 day sample of abilify and recommended that patient present to the [facility] for outpatient services as he is an uninsured [employer] resident.   Please come to [facility] (this facility) during walk in hours for appointment with psychiatrist for further medication management and for therapists for therapy.   Walk in hours are 8-11 AM Monday through Thursday for medication management.It is first come, first -serve; it is best to arrive by 7:00 AM. Child and adolescent psychiatrists are only available on Wednesdays and Thursdays during walk in hours. On Friday from 1 pm to 4 pm for therapy intake only. Please arrive by 12:00 pm as it is  first come, first -serve.   When you arrive please go upstairs for your appointment. If you are unsure of where to go, inform the front desk that you are here for a walk in appointment and they will assist you with directions upstairs.  Address:  [address] in [place], [address] Ph: [phone]     Estella Husk,  MD 08/04/2020, 2:03 PM

## 2021-02-13 DEATH — deceased
